# Patient Record
Sex: Female | Born: 1960 | Race: Black or African American | Hispanic: No | Marital: Married | Smoking: Never smoker
Health system: Southern US, Community
[De-identification: ages and names within clinical notes are randomized; demographics above are authoritative.]

## PROBLEM LIST (undated history)

## (undated) DIAGNOSIS — E669 Obesity, unspecified: Secondary | ICD-10-CM

## (undated) DIAGNOSIS — I10 Essential (primary) hypertension: Secondary | ICD-10-CM

## (undated) DIAGNOSIS — J309 Allergic rhinitis, unspecified: Secondary | ICD-10-CM

## (undated) HISTORY — DX: Essential (primary) hypertension: I10

## (undated) HISTORY — DX: Allergic rhinitis, unspecified: J30.9

## (undated) HISTORY — PX: OTHER SURGICAL HISTORY: SHX169

## (undated) HISTORY — DX: Obesity, unspecified: E66.9

---

## 1998-10-16 ENCOUNTER — Ambulatory Visit (HOSPITAL_COMMUNITY): Admission: RE | Admit: 1998-10-16 | Discharge: 1998-10-16 | Payer: Self-pay | Admitting: Vascular Surgery

## 1998-12-07 ENCOUNTER — Encounter: Payer: Self-pay | Admitting: *Deleted

## 1998-12-07 ENCOUNTER — Emergency Department (HOSPITAL_COMMUNITY): Admission: EM | Admit: 1998-12-07 | Discharge: 1998-12-07 | Payer: Self-pay | Admitting: *Deleted

## 1999-01-18 ENCOUNTER — Other Ambulatory Visit: Admission: RE | Admit: 1999-01-18 | Discharge: 1999-01-18 | Payer: Self-pay | Admitting: Family Medicine

## 2001-07-14 HISTORY — PX: ABDOMINOPLASTY: SUR9

## 2013-05-18 ENCOUNTER — Ambulatory Visit: Payer: Self-pay | Admitting: Emergency Medicine

## 2013-05-18 VITALS — BP 156/92 | HR 83 | Temp 98.2°F | Resp 17 | Ht 64.0 in | Wt 194.0 lb

## 2013-05-18 DIAGNOSIS — S838X9A Sprain of other specified parts of unspecified knee, initial encounter: Secondary | ICD-10-CM

## 2013-05-18 DIAGNOSIS — S86111A Strain of other muscle(s) and tendon(s) of posterior muscle group at lower leg level, right leg, initial encounter: Secondary | ICD-10-CM

## 2013-05-18 MED ORDER — NAPROXEN SODIUM 550 MG PO TABS: 550.0000 mg | ORAL_TABLET | Freq: Two times a day (BID) | ORAL | Status: DC

## 2013-05-18 MED ORDER — CYCLOBENZAPRINE HCL 10 MG PO TABS: 10.0000 mg | ORAL_TABLET | Freq: Three times a day (TID) | ORAL | Status: DC | PRN

## 2013-05-18 NOTE — Progress Notes (Signed)
[facility] [address] [place] [address] [id number] [phone]  Date:  05/18/2013   Name:  Priscilla Lane   DOB:  August 13, 1960   MRN:  [id number]  PCP:  No primary provider on file.    Chief Complaint: Knee Pain   History of Present Illness:  Priscilla Lane is a 52 y.o. very pleasant female patient who presents with the following:  No history of injury or overuse developed pain in her posterior right knee for months.  Last night thinks she felt something "pop" and she has experienced increased pain since.  Able to walk.  No swelling,ecchymosis, deformity or other complaints.  No improvement with over the counter medications or other home remedies. Denies other complaint or health concern today.   There are no active problems to display for this patient.   No past medical history on file.  No past surgical history on file.  History  Substance Use Topics  . Smoking status: Never Smoker   . Smokeless tobacco: Not on file  . Alcohol Use: Not on file    No family history on file.  No Known Allergies  Medication list has been reviewed and updated.  No current outpatient prescriptions on file prior to visit.   No current facility-administered medications on file prior to visit.    Review of Systems:  As per HPI, otherwise negative.    Physical Examination: Filed Vitals:   05/18/13 1819  BP: 156/92  Pulse: 83  Temp: 98.2 F (36.8 C)  Resp: 17   Filed Vitals:   05/18/13 1819  Height: 5\' 4"  (1.626 m)  Weight: 194 lb (87.998 kg)   Body mass index is 33.28 kg/(m^2). Ideal Body Weight: Weight in (lb) to have BMI = 25: 145.3   GEN: WDWN, NAD, Non-toxic, Alert & Oriented x 3 HEENT: Atraumatic, Normocephalic.  Ears and Nose: No external deformity. EXTR: No clubbing/cyanosis/edema NEURO: Normal gait.  PSYCH: Normally interactive. Conversant. Not depressed or anxious appearing.  Calm demeanor.  RIGHT knee:  No swelling, tenderness,  ecchymosis.  No deformity.  Full AROM.    Assessment and Plan: Gastrocnemius tear Anaprox Follow up with ortho    Signed,  Phillips Odor, MD

## 2013-11-27 ENCOUNTER — Ambulatory Visit (INDEPENDENT_AMBULATORY_CARE_PROVIDER_SITE_OTHER): Payer: BC Managed Care – PPO | Admitting: Family Medicine

## 2013-11-27 VITALS — BP 158/90 | HR 70 | Temp 97.8°F | Resp 16 | Ht 62.0 in | Wt 185.6 lb

## 2013-11-27 DIAGNOSIS — K0510 Chronic gingivitis, plaque induced: Secondary | ICD-10-CM

## 2013-11-27 MED ORDER — PENICILLIN V POTASSIUM 500 MG PO TABS: 500.0000 mg | ORAL_TABLET | Freq: Three times a day (TID) | ORAL | Status: DC

## 2013-11-27 NOTE — Progress Notes (Signed)
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 She comes in with a two-day history of illness in her mouth.  Objective: Patient has mild erythema around the gums, no adenopathy Skin:  Normal  Gingivitis - Plan: penicillin v potassium (VEETID) 500 MG tablet  Signed, Robyn Haber, MD

## 2013-11-27 NOTE — Patient Instructions (Signed)
Gingivitis Gingivitis is a form of gum (periodontal) disease that causes redness, soreness, and swelling (inflammation) of your gums. CAUSES The most common cause of gingivitis is poor oral hygiene. A sticky substance made of bacteria, mucus, and food particles (plaque), is deposited on the exposed part of teeth. As plaque builds up, it reacts with the saliva in your mouth to form something called  tartar. Tartar is a hard deposit that becomes trapped around the base of the tooth. Plaque and tartar irritate the gums, leading to the formation of gingivitis. Other factors that increase your risk for gingivitis include:   Tobacco use.  Diabetes.  Older age.  Certain medications.  Certain viral or fungal infections.  Dry mouth.  Hormonal changes such as during pregnancy.  Poor nutrition.  Substance abuse.  Poor fitting dental restorations or appliances. SYMPTOMS You may notice inflammation of the soft tissue (gingiva) around the teeth. When these tissues become inflamed, they bleed easily, especially during flossing or brushing. The gums may also be:   Tender to the touch.  Bright red, purple red, or have a shiny appearance.  Swollen.  Wearing away from the teeth (receding), which exposes more of the tooth. Bad breath is often present. Continued infection around teeth can eventually cause cavities and loosen teeth. This may lead to eventual tooth loss. DIAGNOSIS A medical and dental history will be taken. Your mouth, teeth, and gums will be examined. Your dentist will look for soft, swollen purple-red, irritated gums. There may be deposits of plaque and tartar at the base of the teeth. Your gums will be looked at for the degree of redness, puffiness, and bleeding tendencies. Your dentist will see if any of the teeth are loose. X-rays may be taken to see if the inflammation has spread to the supporting structures of the teeth. TREATMENT The goal is to reduce and reverse the  inflammation. Proper treatment can usually reverse the symptoms of gingivitis and prevent further progression of the disease. Have your teeth cleaned. During the cleaning, all plaque and tartar will be removed. Instruction for proper home care will be given. You will need regular professional cleanings and check-ups in the future. HOME CARE INSTRUCTIONS  Brush your teeth twice a day and floss at least once per day. When flossing, it is best to floss first then brush.  Limit sugar between meals and maintain a well-balanced diet.  Even the best dental hygiene will not prevent plaque from developing. It is necessary for you to see your dentist on a regular basis for cleaning and regular checkups.  Your dentist can recommend proper oral hygiene and mouth care and suggest special toothpastes or mouth rinses.  Stop smoking. SEEK DENTAL OR MEDICAL CARE IF:  You have painful, reddened tissue around your teeth, or you have puffy swollen gums.  You have difficulty chewing.  You notice any loose or infected teeth.  You have swollen glands.  Your gums bleed easily when you brush your teeth or are very tender to the touch. Document Released: 12/24/2000 Document Revised: 09/22/2011 Document Reviewed: 10/04/2010 [employer] Patient Information 2014 [employer].  

## 2014-01-12 ENCOUNTER — Ambulatory Visit (INDEPENDENT_AMBULATORY_CARE_PROVIDER_SITE_OTHER): Payer: BC Managed Care – PPO | Admitting: Family Medicine

## 2014-01-12 ENCOUNTER — Encounter: Payer: Self-pay | Admitting: Family Medicine

## 2014-01-12 VITALS — BP 160/94 | HR 85 | Temp 98.2°F | Ht 62.25 in | Wt 188.0 lb

## 2014-01-12 DIAGNOSIS — M79643 Pain in unspecified hand: Secondary | ICD-10-CM | POA: Insufficient documentation

## 2014-01-12 DIAGNOSIS — E6609 Other obesity due to excess calories: Secondary | ICD-10-CM | POA: Insufficient documentation

## 2014-01-12 DIAGNOSIS — Z6833 Body mass index (BMI) 33.0-33.9, adult: Secondary | ICD-10-CM

## 2014-01-12 DIAGNOSIS — E669 Obesity, unspecified: Secondary | ICD-10-CM

## 2014-01-12 DIAGNOSIS — M79609 Pain in unspecified limb: Secondary | ICD-10-CM

## 2014-01-12 DIAGNOSIS — I10 Essential (primary) hypertension: Secondary | ICD-10-CM | POA: Insufficient documentation

## 2014-01-12 NOTE — Assessment & Plan Note (Signed)
No pain today - advised return when having flare of pain for evaluation.

## 2014-01-12 NOTE — Progress Notes (Signed)
BP 160/94  Pulse 85  Temp(Src) 98.2 F (36.8 C) (Oral)  Ht 5' 2.25" (1.581 m)  Wt 188 lb (85.276 kg)  BMI 34.12 kg/m2  SpO2 98%   CC: new pt to establish  Subjective:    Patient ID: Priscilla Lane, female    DOB: [birth year], 53 y.o.   MRN: [id number]  HPI: Priscilla Lane is a 53 y.o. female presenting on 01/12/2014 for Establish Care   HTN - has had elevated readings in the past. fmhx (mother). No HA, vision changes, CP/tightness, SOB, leg swelling.  BP Readings from Last 3 Encounters:  01/12/14 160/94  11/27/13 158/90  05/18/13 156/92    Bilateral hand pain - ongoing for last year. No swelling, redness, warmth. 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 Denies current paresthesias.  Obesity - tries to walk some with husband. Wt Readings from Last 3 Encounters:  01/12/14 188 lb (85.276 kg)  11/27/13 185 lb 9.6 oz (84.188 kg)  05/18/13 194 lb (87.998 kg)   Body mass index is 34.12 kg/(m^2).  Preventative: No recent CPE Colon cancer screening -  Well woman - no recent pap LMP 2 yrs ago. 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  Allergies and medications reviewed and updated. No current outpatient prescriptions on file prior to visit.   No current facility-administered medications on file prior to visit.    Review of Systems Per HPI unless specifically indicated above    Objective:    BP 160/94  Pulse 85  Temp(Src) 98.2 F (36.8 C) (Oral)  Ht 5' 2.25" (1.581 m)  Wt 188 lb (85.276 kg)  BMI 34.12 kg/m2  SpO2 98%  Physical Exam  Nursing note and vitals reviewed. Constitutional: She appears well-developed and  well-nourished. No distress.  RH  HENT:  Head: Normocephalic and atraumatic.  Mouth/Throat: Oropharynx is clear and moist. No oropharyngeal exudate.  Eyes: Conjunctivae and EOM are normal. Pupils are equal, round, and reactive to light. No scleral icterus.  Neck: Normal range of motion. Neck supple. No thyromegaly present.  Cardiovascular: Normal rate, regular rhythm, normal heart sounds and intact distal pulses.   No murmur heard. Pulmonary/Chest: Effort normal and breath sounds normal. No respiratory distress. She has no wheezes. She has no rales.  Musculoskeletal: She exhibits no edema.  No pain at wrist, CMC, or Thumb joint. No pain at anatomical snuff box. L hand with nodule lateral ventral wrist, nontender to palpation  Skin: Skin is warm and dry. No rash noted.  Psychiatric: She has a normal mood and affect.   No results found for this or any previous visit.    Assessment & Plan:   Problem List Items Addressed This Visit   Obesity     Reviewed importance of regular exercise to affect sustained weight loss. Reviewed healthy diet changes.    Hypertension, essential - Primary     Elevated today. Discussed dietary changes and lifestyle changes to affect improved BP. Patient education handout provided as well as DASH diet handing. Will recheck next visit at CPE, if persistently elevated, will start bp med. Pt agrees with plan.    Hand pain     No pain today - advised return when  having flare of pain for evaluation.        Follow up plan: Return in about 3 months (around 04/14/2014), or as needed, for annual exam, prior fasting for blood work.

## 2014-01-12 NOTE — Assessment & Plan Note (Signed)
Elevated today. Discussed dietary changes and lifestyle changes to affect improved BP. Patient education handout provided as well as DASH diet handing. Will recheck next visit at CPE, if persistently elevated, will start bp med. Pt agrees with plan.

## 2014-01-12 NOTE — Assessment & Plan Note (Signed)
Reviewed importance of regular exercise to affect sustained weight loss. Reviewed healthy diet changes.

## 2014-01-12 NOTE — Patient Instructions (Signed)
Your goal blood pressure is <140/90. Look into bp cuff for home use , or may check bp at local pharmacy. Start monitoring bp at home or local pharmacy a few times a month and jot #s down.  Work on low salt/sodium diet - goal <1.5gm (1,500mg  sodium) per day. Eat a diet high in fruits/vegetables and whole grains.  Look into mediterranean and DASH diet. Goal activity is 189min/wk of moderate intensity exercise.  This can be split into 30 minute chunks.  If you are not at this level, you can start with smaller 10-15 min increments and slowly build up activity. Look at [place].org for more resources. We'll recheck your blood pressure at your physical.  DASH Eating Plan DASH stands for "Dietary Approaches to Stop Hypertension." The DASH eating plan is a healthy eating plan that has been shown to reduce high blood pressure (hypertension). Additional health benefits may include reducing the risk of type 2 diabetes mellitus, heart disease, and stroke. The DASH eating plan may also help with weight loss. WHAT DO I NEED TO KNOW ABOUT THE DASH EATING PLAN? For the DASH eating plan, you will follow these general guidelines:  Choose foods with a percent daily value for sodium of less than 5% (as listed on the food label).  Use salt-free seasonings or herbs instead of table salt or sea salt.  Check with your health care provider or pharmacist before using salt substitutes.  Eat lower-sodium products, often labeled as "lower sodium" or "no salt added."  Eat fresh foods.  Eat more vegetables, fruits, and low-fat dairy products.  Choose whole grains. Look for the word "whole" as the first word in the ingredient list.  Choose fish and skinless chicken or [place] more often than red meat. Limit fish, poultry, and meat to 6 oz (170 g) each day.  Limit sweets, desserts, sugars, and sugary drinks.  Choose heart-healthy fats.  Limit cheese to 1 oz (28 g) per day.  Eat more home-cooked food and less  restaurant, buffet, and fast food.  Limit fried foods.  Cook foods using methods other than frying.  Limit canned vegetables. If you do use them, rinse them well to decrease the sodium.  When eating at a restaurant, ask that your food be prepared with less salt, or no salt if possible. WHAT FOODS CAN I EAT? Seek help from a dietitian for individual calorie needs. Grains Whole grain or whole wheat bread. Brown rice. Whole grain or whole wheat pasta. Quinoa, bulgur, and whole grain cereals. Low-sodium cereals. Corn or whole wheat flour tortillas. Whole grain cornbread. Whole grain crackers. Low-sodium crackers. Vegetables Fresh or frozen vegetables (raw, steamed, roasted, or grilled). Low-sodium or reduced-sodium tomato and vegetable juices. Low-sodium or reduced-sodium tomato sauce and paste. Low-sodium or reduced-sodium canned vegetables.  Fruits All fresh, canned (in natural juice), or frozen fruits. Meat and Other Protein Products Ground beef (85% or leaner), grass-fed beef, or beef trimmed of fat. Skinless chicken or [place]. Ground chicken or [place]. Pork trimmed of fat. All fish and seafood. Eggs. Dried beans, peas, or lentils. Unsalted nuts and seeds. Unsalted canned beans. Dairy Low-fat dairy products, such as skim or 1% milk, 2% or reduced-fat cheeses, low-fat ricotta or cottage cheese, or plain low-fat yogurt. Low-sodium or reduced-sodium cheeses. Fats and Oils Tub margarines without trans fats. Light or reduced-fat mayonnaise and salad dressings (reduced sodium). Avocado. Safflower, olive, or canola oils. Natural peanut or almond butter. Other Unsalted popcorn and pretzels. The items listed above may not be  a complete list of recommended foods or beverages. Contact your dietitian for more options. WHAT FOODS ARE NOT RECOMMENDED? Grains White bread. White pasta. White rice. Refined cornbread. Bagels and croissants. Crackers that contain trans fat. Vegetables Creamed or fried  vegetables. Vegetables in a cheese sauce. Regular canned vegetables. Regular canned tomato sauce and paste. Regular tomato and vegetable juices. Fruits Dried fruits. Canned fruit in light or heavy syrup. Fruit juice. Meat and Other Protein Products Fatty cuts of meat. Ribs, chicken wings, bacon, sausage, bologna, salami, chitterlings, fatback, hot dogs, bratwurst, and packaged luncheon meats. Salted nuts and seeds. Canned beans with salt. Dairy Whole or 2% milk, cream, half-and-half, and cream cheese. Whole-fat or sweetened yogurt. Full-fat cheeses or blue cheese. Nondairy creamers and whipped toppings. Processed cheese, cheese spreads, or cheese curds. Condiments Onion and garlic salt, seasoned salt, table salt, and sea salt. Canned and packaged gravies. Worcestershire sauce. Tartar sauce. Barbecue sauce. Teriyaki sauce. Soy sauce, including reduced sodium. Steak sauce. Fish sauce. Oyster sauce. Cocktail sauce. Horseradish. Ketchup and mustard. Meat flavorings and tenderizers. Bouillon cubes. Hot sauce. Tabasco sauce. Marinades. Taco seasonings. Relishes. Fats and Oils Butter, stick margarine, lard, shortening, ghee, and bacon fat. Coconut, palm kernel, or palm oils. Regular salad dressings. Other Pickles and olives. Salted popcorn and pretzels. The items listed above may not be a complete list of foods and beverages to avoid. Contact your dietitian for more information. WHERE CAN I FIND MORE INFORMATION? National Heart, Lung, and Blood Institute: [employer]stabloid.com Document Released: 06/19/2011 Document Revised: 07/05/2013 Document Reviewed: 05/04/2013 [facility] Patient Information 2015 [place], [place]. This information is not intended to replace advice given to you by your health care provider. Make sure you discuss any questions you have with your health care provider.

## 2014-01-12 NOTE — Progress Notes (Signed)
Pre visit review using our clinic review tool, if applicable. No additional management support is needed unless otherwise documented below in the visit note. 

## 2014-04-08 ENCOUNTER — Other Ambulatory Visit: Payer: Self-pay | Admitting: Family Medicine

## 2014-04-08 DIAGNOSIS — I10 Essential (primary) hypertension: Secondary | ICD-10-CM

## 2014-04-08 DIAGNOSIS — E669 Obesity, unspecified: Secondary | ICD-10-CM

## 2014-04-10 ENCOUNTER — Other Ambulatory Visit (INDEPENDENT_AMBULATORY_CARE_PROVIDER_SITE_OTHER): Payer: BC Managed Care – PPO

## 2014-04-10 DIAGNOSIS — I10 Essential (primary) hypertension: Secondary | ICD-10-CM

## 2014-04-10 DIAGNOSIS — E669 Obesity, unspecified: Secondary | ICD-10-CM

## 2014-04-10 LAB — LIPID PANEL
Cholesterol: 198 mg/dL (ref 0–200)
HDL: 55.6 mg/dL (ref >39.00)
LDL Cholesterol: 128 mg/dL — ABNORMAL HIGH (ref 0–99)
NonHDL: 142.4
Total CHOL/HDL Ratio: 4
Triglycerides: 74 mg/dL (ref 0.0–149.0)
VLDL: 14.8 mg/dL (ref 0.0–40.0)

## 2014-04-10 LAB — COMPREHENSIVE METABOLIC PANEL
ALT: 25 U/L (ref 0–35)
AST: 30 U/L (ref 0–37)
Albumin: 4.3 g/dL (ref 3.5–5.2)
Alkaline Phosphatase: 37 U/L — ABNORMAL LOW (ref 39–117)
BUN: 14 mg/dL (ref 6–23)
CO2: 29 mEq/L (ref 19–32)
Calcium: 9.3 mg/dL (ref 8.4–10.5)
Chloride: 104 mEq/L (ref 96–112)
Creatinine, Ser: 0.7 mg/dL (ref 0.4–1.2)
GFR: 117.77 mL/min (ref >60.00)
Glucose, Bld: 92 mg/dL (ref 70–99)
Potassium: 3.9 mEq/L (ref 3.5–5.1)
Sodium: 138 mEq/L (ref 135–145)
Total Bilirubin: 0.6 mg/dL (ref 0.2–1.2)
Total Protein: 7.2 g/dL (ref 6.0–8.3)

## 2014-04-10 LAB — TSH: TSH: 3.5 u[IU]/mL (ref 0.35–4.50)

## 2014-04-14 ENCOUNTER — Ambulatory Visit (INDEPENDENT_AMBULATORY_CARE_PROVIDER_SITE_OTHER): Payer: BC Managed Care – PPO | Admitting: Family Medicine

## 2014-04-14 ENCOUNTER — Encounter: Payer: Self-pay | Admitting: Internal Medicine

## 2014-04-14 ENCOUNTER — Other Ambulatory Visit (HOSPITAL_COMMUNITY)
Admission: RE | Admit: 2014-04-14 | Discharge: 2014-04-14 | Disposition: A | Discharge status: Home / Self Care | Payer: BC Managed Care – PPO | Source: Ambulatory Visit | Attending: Family Medicine | Admitting: Family Medicine

## 2014-04-14 ENCOUNTER — Encounter: Payer: Self-pay | Admitting: Family Medicine

## 2014-04-14 VITALS — BP 136/78 | HR 91 | Temp 98.3°F | Ht 62.0 in | Wt 182.0 lb

## 2014-04-14 DIAGNOSIS — Z0000 Encounter for general adult medical examination without abnormal findings: Secondary | ICD-10-CM | POA: Insufficient documentation

## 2014-04-14 DIAGNOSIS — Z1151 Encounter for screening for human papillomavirus (HPV): Secondary | ICD-10-CM | POA: Insufficient documentation

## 2014-04-14 DIAGNOSIS — Z1211 Encounter for screening for malignant neoplasm of colon: Secondary | ICD-10-CM

## 2014-04-14 DIAGNOSIS — Z01411 Encounter for gynecological examination (general) (routine) with abnormal findings: Secondary | ICD-10-CM | POA: Insufficient documentation

## 2014-04-14 DIAGNOSIS — Z1239 Encounter for other screening for malignant neoplasm of breast: Secondary | ICD-10-CM

## 2014-04-14 DIAGNOSIS — N760 Acute vaginitis: Secondary | ICD-10-CM | POA: Diagnosis not present

## 2014-04-14 DIAGNOSIS — I10 Essential (primary) hypertension: Secondary | ICD-10-CM

## 2014-04-14 DIAGNOSIS — Z23 Encounter for immunization: Secondary | ICD-10-CM

## 2014-04-14 NOTE — Progress Notes (Signed)
BP 136/78  Pulse 91  Temp(Src) 98.3 F (36.8 C) (Oral)  Ht 5\' 2"  (1.575 m)  Wt 182 lb (82.555 kg)  BMI 33.28 kg/m2  SpO2 96%   CC: CPE  Subjective:    Patient ID: Priscilla Lane, female    DOB: 10/11/1960, 53 y.o.   MRN: [id number]  HPI: Priscilla Lane is a 53 y.o. female presenting on 04/14/2014 for Annual Exam   bp running better today. Has decreased salt in diet.  BP Readings from Last 3 Encounters:  04/14/14 136/78  01/12/14 160/94  11/27/13 158/90   Wt Readings from Last 3 Encounters:  04/14/14 182 lb (82.555 kg)  01/12/14 188 lb (85.276 kg)  11/27/13 185 lb 9.6 oz (84.188 kg)    Preventative:  Colon cancer screening - discussed, would like colonoscopy Well woman - no recent pap. LMP 2 yrs ago. Menopausal at age 5yo. Occasional hot flashes.  Mammogram - 2002. Due for this. Breast exam at home and today. 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  Allergies and medications reviewed and updated. No current outpatient prescriptions on file prior to visit.   No current facility-administered medications on file prior to visit.    Review of Systems  Constitutional: Negative for fever, chills, activity change, appetite change, fatigue and unexpected weight change.  HENT: Positive for sinus pressure (with weather change). Negative for hearing loss.   Eyes: Negative for visual disturbance.  Respiratory: Positive for cough (started today - allergy related?). Negative for chest tightness, shortness of breath and wheezing.   Cardiovascular: Negative for chest pain, palpitations and leg swelling.  Gastrointestinal: Negative for nausea,  vomiting, abdominal pain, diarrhea, constipation, blood in stool and abdominal distention.  Genitourinary: Negative for hematuria and difficulty urinating.  Musculoskeletal: Negative for arthralgias, myalgias and neck pain.  Skin: Negative for rash.  Neurological: Negative for dizziness, seizures, syncope and headaches.  Hematological: Negative for adenopathy. Does not bruise/bleed easily.  Psychiatric/Behavioral: Negative for dysphoric mood. The patient is not nervous/anxious.    Per HPI unless specifically indicated above    Objective:    BP 136/78  Pulse 91  Temp(Src) 98.3 F (36.8 C) (Oral)  Ht 5\' 2"  (1.575 m)  Wt 182 lb (82.555 kg)  BMI 33.28 kg/m2  SpO2 96%  Physical Exam  Nursing note and vitals reviewed. Constitutional: She is oriented to person, place, and time. She appears well-developed and well-nourished. No distress.  HENT:  Head: Normocephalic and atraumatic.  Right Ear: Hearing, tympanic membrane, external ear and ear canal normal.  Left Ear: Hearing, tympanic membrane, external ear and ear canal normal.  Nose: Nose normal.  Mouth/Throat: Uvula is midline, oropharynx is clear and moist and mucous membranes are normal. No oropharyngeal exudate, posterior oropharyngeal edema or posterior oropharyngeal erythema.  Eyes: Conjunctivae and EOM are normal. Pupils are equal, round, and reactive to light. No scleral icterus.  Neck: Normal range of motion. Neck supple. No thyromegaly present.  Cardiovascular: Normal rate, regular rhythm, normal heart sounds and intact distal pulses.   No murmur heard. Pulses:      Radial pulses are 2+ on the right side, and 2+ on the left side.  Pulmonary/Chest: Effort normal  and breath sounds normal. No respiratory distress. She has no wheezes. She has no rales. Right breast exhibits no inverted nipple, no mass, no nipple discharge, no skin change and no tenderness. Left breast exhibits no inverted nipple, no mass, no nipple discharge, no skin  change and no tenderness.  Abdominal: Soft. Bowel sounds are normal. She exhibits no distension and no mass. There is no tenderness. There is no rebound and no guarding.  Genitourinary: Uterus normal. Pelvic exam was performed with patient supine. There is no rash, tenderness, lesion or injury on the right labia. There is no rash, tenderness, lesion or injury on the left labia. Cervix exhibits no motion tenderness, no discharge and no friability. Right adnexum displays no mass, no tenderness and no fullness. Left adnexum displays no mass, no tenderness and no fullness. Vaginal discharge (mild thick white) found.  Pt denies any yeast vaginitis sxs  Musculoskeletal: Normal range of motion. She exhibits no edema.  Lymphadenopathy:       Head (right side): No submental, no submandibular, no tonsillar, no preauricular and no posterior auricular adenopathy present.       Head (left side): No submental, no submandibular, no tonsillar, no preauricular and no posterior auricular adenopathy present.    She has no cervical adenopathy.    She has no axillary adenopathy.       Right axillary: No lateral adenopathy present.       Left axillary: No lateral adenopathy present.      Right: No supraclavicular adenopathy present.       Left: No supraclavicular adenopathy present.  Neurological: She is alert and oriented to person, place, and time.  CN grossly intact, station and gait intact  Skin: Skin is warm and dry. No rash noted.  Psychiatric: She has a normal mood and affect. Her behavior is normal. Judgment and thought content normal.   Results for orders placed in visit on 04/10/14  LIPID PANEL      Result Value Ref Range   Cholesterol 198  0 - 200 mg/dL   Triglycerides 74.0  0.0 - 149.0 mg/dL   HDL 55.60  >39.00 mg/dL   VLDL 14.8  0.0 - 40.0 mg/dL   LDL Cholesterol 128 (*) 0 - 99 mg/dL   Total CHOL/HDL Ratio 4     NonHDL 142.40    COMPREHENSIVE METABOLIC PANEL      Result Value Ref Range   Sodium  138  135 - 145 mEq/L   Potassium 3.9  3.5 - 5.1 mEq/L   Chloride 104  96 - 112 mEq/L   CO2 29  19 - 32 mEq/L   Glucose, Bld 92  70 - 99 mg/dL   BUN 14  6 - 23 mg/dL   Creatinine, Ser 0.7  0.4 - 1.2 mg/dL   Total Bilirubin 0.6  0.2 - 1.2 mg/dL   Alkaline Phosphatase 37 (*) 39 - 117 U/L   AST 30  0 - 37 U/L   ALT 25  0 - 35 U/L   Total Protein 7.2  6.0 - 8.3 g/dL   Albumin 4.3  3.5 - 5.2 g/dL   Calcium 9.3  8.4 - 10.5 mg/dL   GFR 117.77  >60.00 mL/min  TSH      Result Value Ref Range   TSH 3.50  0.35 - 4.50 uIU/mL      Assessment & Plan:   Problem List Items Addressed This Visit   Hypertension, essential     Stable today off meds.  She has bought bp cuff so she can monitor bp at home. Will notify me if persistently elevated.    Health maintenance examination - Primary     Preventative protocols reviewed and updated unless pt declined. Discussed healthy diet and lifestyle. Breast exam, will schedule mammogram and colonoscopy per pt request. Encouraged start calcium /vit D (600/400) 1 tablet daily given calcium deficiency in diet. Tdap today.     Other Visit Diagnoses   Colon cancer screening        Relevant Orders       Ambulatory referral to Gastroenterology    Screening for breast cancer        Relevant Orders       MM DIGITAL SCREENING BILATERAL        Follow up plan: Return in about 1 year (around 04/15/2015), or as needed, for physical.

## 2014-04-14 NOTE — Assessment & Plan Note (Signed)
Preventative protocols reviewed and updated unless pt declined. Discussed healthy diet and lifestyle. Breast exam, will schedule mammogram and colonoscopy per pt request. Encouraged start calcium /vit D (600/400) 1 tablet daily given calcium deficiency in diet. Tdap today.

## 2014-04-14 NOTE — Patient Instructions (Signed)
Tdap today. Pass by Priscilla Lane's office for referral for mammogram and colonoscopy. Look into calcium/vit D supplement once daily (I'd recommend 600mg /400 unit dose). Good to see you today, call us with questions. Return as needed or in 1 year for next physical

## 2014-04-14 NOTE — Assessment & Plan Note (Signed)
Stable today off meds. She has bought bp cuff so she can monitor bp at home. Will notify me if persistently elevated.

## 2014-04-14 NOTE — Progress Notes (Signed)
Pre visit review using our clinic review tool, if applicable. No additional management support is needed unless otherwise documented below in the visit note. 

## 2014-04-17 ENCOUNTER — Telehealth: Payer: Self-pay | Admitting: Family Medicine

## 2014-04-17 LAB — CYTOLOGY - PAP

## 2014-04-17 NOTE — Telephone Encounter (Signed)
emmi mailed  °

## 2014-04-24 LAB — CERVICOVAGINAL ANCILLARY ONLY: CANDIDA VAGINITIS: NEGATIVE

## 2014-05-05 ENCOUNTER — Ambulatory Visit
Admission: RE | Admit: 2014-05-05 | Discharge: 2014-05-05 | Disposition: A | Discharge status: Home / Self Care | Payer: BC Managed Care – PPO | Source: Ambulatory Visit | Attending: Family Medicine | Admitting: Family Medicine

## 2014-05-05 DIAGNOSIS — Z1239 Encounter for other screening for malignant neoplasm of breast: Secondary | ICD-10-CM

## 2014-05-23 ENCOUNTER — Other Ambulatory Visit: Payer: Self-pay | Admitting: Family Medicine

## 2014-05-23 DIAGNOSIS — R928 Other abnormal and inconclusive findings on diagnostic imaging of breast: Secondary | ICD-10-CM

## 2014-06-05 ENCOUNTER — Ambulatory Visit
Admission: RE | Admit: 2014-06-05 | Discharge: 2014-06-05 | Disposition: A | Discharge status: Home / Self Care | Payer: BC Managed Care – PPO | Source: Ambulatory Visit | Attending: Family Medicine | Admitting: Family Medicine

## 2014-06-05 DIAGNOSIS — R928 Other abnormal and inconclusive findings on diagnostic imaging of breast: Secondary | ICD-10-CM

## 2014-06-05 LAB — HM MAMMOGRAPHY: HM Mammogram: NORMAL

## 2014-06-06 ENCOUNTER — Encounter: Payer: Self-pay | Admitting: *Deleted

## 2014-06-13 HISTORY — PX: COLONOSCOPY: SHX174

## 2014-06-19 ENCOUNTER — Ambulatory Visit (AMBULATORY_SURGERY_CENTER): Payer: Self-pay | Admitting: *Deleted

## 2014-06-19 VITALS — Ht 66.0 in | Wt 186.0 lb

## 2014-06-19 DIAGNOSIS — Z1211 Encounter for screening for malignant neoplasm of colon: Secondary | ICD-10-CM

## 2014-06-19 MED ORDER — MOVIPREP 100 G PO SOLR: 1.0000 | Freq: Once | ORAL | Status: DC

## 2014-06-19 NOTE — Progress Notes (Signed)
No egg or soy allergy. ewm no blood thinners, no diet pills. ewm No problems with past sedation. ewm No home 02 use. ewm Pt declined emmi. ewm

## 2014-07-03 ENCOUNTER — Encounter: Payer: Self-pay | Admitting: Internal Medicine

## 2014-07-03 ENCOUNTER — Ambulatory Visit (AMBULATORY_SURGERY_CENTER): Payer: BC Managed Care – PPO | Admitting: Internal Medicine

## 2014-07-03 VITALS — BP 157/99 | HR 66 | Resp 21

## 2014-07-03 DIAGNOSIS — K635 Polyp of colon: Secondary | ICD-10-CM

## 2014-07-03 DIAGNOSIS — D120 Benign neoplasm of cecum: Secondary | ICD-10-CM

## 2014-07-03 DIAGNOSIS — Z1211 Encounter for screening for malignant neoplasm of colon: Secondary | ICD-10-CM

## 2014-07-03 MED ORDER — SODIUM CHLORIDE 0.9 % IV SOLN: 500.0000 mL | INTRAVENOUS | Status: DC

## 2014-07-03 NOTE — Progress Notes (Signed)
Report to PACU, RN, vss, BBS= Clear.  

## 2014-07-03 NOTE — Progress Notes (Signed)
Called to room to assist during endoscopic procedure.  Patient ID and intended procedure confirmed with present staff. Received instructions for my participation in the procedure from the performing physician.  

## 2014-07-03 NOTE — Patient Instructions (Signed)
Discharge instructions given. Handouts on polyps and diverticulosis. Resume previous medications. YOU HAD AN ENDOSCOPIC PROCEDURE TODAY AT THE [facility]: Refer to the procedure report that was given to you for any specific questions about what was found during the examination.  If the procedure report does not answer your questions, please call your gastroenterologist to clarify.  If you requested that your care partner not be given the details of your procedure findings, then the procedure report has been included in a sealed envelope for you to review at your convenience later.  YOU SHOULD EXPECT: Some feelings of bloating in the abdomen. Passage of more gas than usual.  Walking can help get rid of the air that was put into your GI tract during the procedure and reduce the bloating. If you had a lower endoscopy (such as a colonoscopy or flexible sigmoidoscopy) you may notice spotting of blood in your stool or on the toilet paper. If you underwent a bowel prep for your procedure, then you may not have a normal bowel movement for a few days.  DIET: Your first meal following the procedure should be a light meal and then it is ok to progress to your normal diet.  A half-sandwich or bowl of soup is an example of a good first meal.  Heavy or fried foods are harder to digest and may make you feel nauseous or bloated.  Likewise meals heavy in dairy and vegetables can cause extra gas to form and this can also increase the bloating.  Drink plenty of fluids but you should avoid alcoholic beverages for 24 hours.  ACTIVITY: Your care partner should take you home directly after the procedure.  You should plan to take it easy, moving slowly for the rest of the day.  You can resume normal activity the day after the procedure however you should NOT DRIVE or use heavy machinery for 24 hours (because of the sedation medicines used during the test).    SYMPTOMS TO REPORT IMMEDIATELY: A gastroenterologist  can be reached at any hour.  During normal business hours, 8:30 AM to 5:00 PM Monday through Friday, call [phone].  After hours and on weekends, please call the GI answering service at [phone] who will take a message and have the physician on call contact you.   Following lower endoscopy (colonoscopy or flexible sigmoidoscopy):  Excessive amounts of blood in the stool  Significant tenderness or worsening of abdominal pains  Swelling of the abdomen that is new, acute  Fever of 100F or higher  FOLLOW UP: If any biopsies were taken you will be contacted by phone or by letter within the next 1-3 weeks.  Call your gastroenterologist if you have not heard about the biopsies in 3 weeks.  Our staff will call the home number listed on your records the next business day following your procedure to check on you and address any questions or concerns that you may have at that time regarding the information given to you following your procedure. This is a courtesy call and so if there is no answer at the home number and we have not heard from you through the emergency physician on call, we will assume that you have returned to your regular daily activities without incident.  SIGNATURES/CONFIDENTIALITY: You and/or your care partner have signed paperwork which will be entered into your electronic medical record.  These signatures attest to the fact that that the information above on your After Visit Summary has been reviewed   and is understood.  Full responsibility of the confidentiality of this discharge information lies with you and/or your care-partner. 

## 2014-07-03 NOTE — Op Note (Signed)
[place]  [address] [place], [id number]   COLONOSCOPY PROCEDURE REPORT  PATIENT: Priscilla Lane, Priscilla Lane  MR#: [id number] BIRTHDATE: March 21, 1961 , 59  yrs. old GENDER: female ENDOSCOPIST: Eustace Quail, MD REFERRED TD:HRCBUL Gutierrez, M.D. PROCEDURE DATE:  07/03/2014 PROCEDURE:   Colonoscopy with snare polypectomy x 1 First Screening Colonoscopy - Avg.  risk and is 50 yrs.  old or older Yes.  Prior Negative Screening - Now for repeat screening. N/A  History of Adenoma - Now for follow-up colonoscopy & has been > or = to 3 yrs.  N/A  Polyps Removed Today? Yes. ASA CLASS:   Class II INDICATIONS:average risk for colorectal cancer. MEDICATIONS: Monitored anesthesia care and Propofol 200 mg IV  DESCRIPTION OF PROCEDURE:   After the risks benefits and alternatives of the procedure were thoroughly explained, informed consent was obtained.  The digital rectal exam revealed no abnormalities of the rectum.   The LB [id number]  endoscope was introduced through the anus and advanced to the cecum, which was identified by both the appendix and ileocecal valve. No adverse events experienced.   The quality of the prep was excellent, using MoviPrep  The instrument was then slowly withdrawn as the colon was fully examined.    COLON FINDINGS: A single polyp measuring 2 mm in size was found at the cecum.  A polypectomy was performed with a cold snare.  The resection was complete, the polyp tissue was completely retrieved and sent to histology.   There was moderate diverticulosis noted throughout the entire examined colon.   The examination was otherwise normal.  Retroflexed views revealed no abnormalities. The time to cecum=2 minutes 33 seconds.  Withdrawal time=8 minutes 59 seconds.  The scope was withdrawn and the procedure completed. COMPLICATIONS: There were no immediate complications.  ENDOSCOPIC IMPRESSION: 1.   Single polyp measuring 2 mm in size was found at the  cecum; polypectomy was performed with a cold snare 2.   Moderate diverticulosis was noted throughout the entire examined colon 3.   The examination was otherwise normal  RECOMMENDATIONS: 1. Repeat colonoscopy in 5 years if polyp adenomatous; otherwise 10 years  eSigned:  Eustace Quail, MD 07/03/2014 9:50 AM   cc: Ria Bush MD and The Patient

## 2014-07-04 ENCOUNTER — Telehealth: Payer: Self-pay

## 2014-07-04 NOTE — Telephone Encounter (Signed)
  Follow up Call-  Call back number 07/03/2014  Post procedure Call Back phone  # [phone]  Permission to leave phone message Yes     Patient questions:  Do you have a fever, pain , or abdominal swelling? No. Pain Score  0 *  Have you tolerated food without any problems? Yes.    Have you been able to return to your normal activities? Yes.    Do you have any questions about your discharge instructions: Diet   No. Medications  No. Follow up visit  No.  Do you have questions or concerns about your Care? No.  Actions: * If pain score is 4 or above: No action needed, pain <4.

## 2014-07-06 ENCOUNTER — Encounter: Payer: Self-pay | Admitting: Internal Medicine

## 2014-07-12 ENCOUNTER — Encounter: Payer: Self-pay | Admitting: Family Medicine

## 2014-07-28 ENCOUNTER — Telehealth: Payer: Self-pay

## 2014-07-28 NOTE — Telephone Encounter (Signed)
[facility] on [address]   Requesting  Refill on mouth wash    [phone]

## 2014-07-28 NOTE — Telephone Encounter (Signed)
Advised to RTC. Says we gave her something for her gingivitis back in 11/2013 (although I didn't see anything other than PCN) She will come in

## 2014-07-29 ENCOUNTER — Ambulatory Visit (INDEPENDENT_AMBULATORY_CARE_PROVIDER_SITE_OTHER): Payer: BLUE CROSS/BLUE SHIELD | Admitting: Family Medicine

## 2014-07-29 VITALS — BP 158/86 | HR 81 | Temp 98.5°F | Resp 16 | Ht 62.25 in | Wt 184.2 lb

## 2014-07-29 DIAGNOSIS — J029 Acute pharyngitis, unspecified: Secondary | ICD-10-CM

## 2014-07-29 MED ORDER — MAGIC MOUTHWASH: 5.0000 mL | Freq: Four times a day (QID) | ORAL | Status: DC | PRN

## 2014-07-29 MED ORDER — PENICILLIN V POTASSIUM 500 MG PO TABS: 500.0000 mg | ORAL_TABLET | Freq: Three times a day (TID) | ORAL | Status: DC

## 2014-07-29 NOTE — Progress Notes (Signed)
° °  Subjective:    Patient ID: Priscilla Lane, female    DOB: [birth year], 54 y.o.   MRN: [id number] This chart was scribed for Priscilla Haber, MD by Marti Sleigh, Medical Scribe. This patient was seen in Room 11 and the patient's care was started a 12:50 PM.  Chief Complaint  Patient presents with   Mouth Irritation    HPI HPI Comments: [name] is a 54 y.o. female who presents to [facility] complaining of mouth irritation that started on Friday. Pt states she has had this happen before. Pt states in the past she has been prescribed a mouth wash which relieved her sx.    Review of Systems  Constitutional: Negative for fever and chills.  HENT: Positive for mouth sores.        Objective:   Physical Exam  Constitutional: She is oriented to person, place, and time. She appears well-developed and well-nourished.  HENT:  Head: Normocephalic and atraumatic.  Eyes: Pupils are equal, round, and reactive to light.  Neck: Neck supple.  Cardiovascular: Normal rate and regular rhythm.   Pulmonary/Chest: Effort normal and breath sounds normal. No respiratory distress.  Neurological: She is alert and oriented to person, place, and time.  Skin: Skin is warm and dry.  Psychiatric: She has a normal mood and affect. Her behavior is normal.  Nursing note and vitals reviewed.  Buccal mucosa reddened without exudates    Assessment & Plan:   This chart was scribed in my presence and reviewed by me personally.    ICD-9-CM ICD-10-CM   1. Acute pharyngitis, unspecified pharyngitis type 462 J02.9 Alum & Mag Hydroxide-Simeth (MAGIC MOUTHWASH) SOLN     penicillin v potassium (VEETID) 500 MG tablet     Signed, Priscilla Haber, MD

## 2014-09-22 ENCOUNTER — Ambulatory Visit (INDEPENDENT_AMBULATORY_CARE_PROVIDER_SITE_OTHER): Payer: BLUE CROSS/BLUE SHIELD | Admitting: Physician Assistant

## 2014-09-22 VITALS — BP 150/92 | HR 82 | Temp 97.5°F | Ht 63.0 in | Wt 188.8 lb

## 2014-09-22 DIAGNOSIS — M6248 Contracture of muscle, other site: Secondary | ICD-10-CM

## 2014-09-22 DIAGNOSIS — R030 Elevated blood-pressure reading, without diagnosis of hypertension: Secondary | ICD-10-CM

## 2014-09-22 DIAGNOSIS — M542 Cervicalgia: Secondary | ICD-10-CM | POA: Diagnosis not present

## 2014-09-22 DIAGNOSIS — M62838 Other muscle spasm: Secondary | ICD-10-CM

## 2014-09-22 DIAGNOSIS — IMO0001 Reserved for inherently not codable concepts without codable children: Secondary | ICD-10-CM

## 2014-09-22 MED ORDER — NAPROXEN 500 MG PO TABS: 500.0000 mg | ORAL_TABLET | Freq: Two times a day (BID) | ORAL | Status: DC

## 2014-09-22 MED ORDER — CYCLOBENZAPRINE HCL 10 MG PO TABS: 5.0000 mg | ORAL_TABLET | Freq: Three times a day (TID) | ORAL | Status: DC | PRN

## 2014-09-22 NOTE — Progress Notes (Signed)
09/22/2014 at 10:49 AM  Priscilla Lane / DOB: [birth year] / MRN: [id number]  The patient has Hypertension, essential; Obesity; Hand pain; and Health maintenance examination on her problem list.  SUBJECTIVE  Chief compalaint: Neck Pain   History of present illness: Priscilla Lane is 54 y.o. well appearing female presenting for left sided neck and upper back pain that started Monday and has gradually worsened.    She has tried Aleve and Advil with modest relief of her pain. She feels that it started with "sleeping in a funny position." She denies upper extremity weakness, paresthesia and a change in dexterity. She denies HA, and changes in vision.     She was unaware of her previous diagnosis of hypertension, and says she just saw her PCP in January.    She  has a past medical history of Allergic rhinitis; Hypertension, essential; and Obesity.    She currently has no medications in their medication list.  Priscilla Lane has No Known Allergies. She  reports that she has never smoked. She has never used smokeless tobacco. She reports that she does not drink alcohol or use illicit drugs. She  has no sexual activity history on file. The patient  has past surgical history that includes Abdominoplasty (2003) and Colonoscopy (06/2014).  Her family history includes CAD (age of onset: 76) in her mother; Cancer in her maternal grandfather; Cancer (age of onset: 34) in her maternal grandmother; Colon polyps in her father; Diabetes in her mother; Hypertension in her mother; Kidney disease in her mother; Ovarian cancer in her maternal grandmother. There is no history of Colon cancer, Rectal cancer, or Stomach cancer.  ROS  Per HPI  OBJECTIVE  Her  height is 5\' 3"  (1.6 m) and weight is 188 lb 12.8 oz (85.639 kg). Her oral temperature is 97.5 F (36.4 C). Her blood pressure is 150/92 and her pulse is 82. Her oxygen saturation is 98%.  The patient's body mass index is 33.45 kg/(m^2).  Physical Exam    Constitutional: She is oriented to person, place, and time.  Musculoskeletal:       Cervical back: She exhibits decreased range of motion (2/2 musclular tightness. ), tenderness, pain and spasm. She exhibits no bony tenderness, no swelling, no edema, no deformity and no laceration.       Back:  Neurological: She is alert and oriented to person, place, and time. She displays no atrophy and no tremor. No sensory deficit. She exhibits normal muscle tone. She displays no seizure activity. Coordination and gait normal.  Reflex Scores:      Tricep reflexes are 2+ on the right side and 2+ on the left side.      Bicep reflexes are 2+ on the right side and 2+ on the left side.      Brachioradialis reflexes are 2+ on the right side and 2+ on the left side.   No results found for this or any previous visit (from the past 24 hour(s)).  ASSESSMENT & PLAN  Priscilla Lane was seen today for neck pain.  Diagnoses and all orders for this visit:  Neck pain, musculoskeletal Orders: -     naproxen (NAPROSYN) 500 MG tablet; Take 1 tablet (500 mg total) by mouth 2 (two) times daily with a meal.  Muscle spasms of neck Orders: -     cyclobenzaprine (FLEXERIL) 10 MG tablet; Take 0.5-1 tablets (5-10 mg total) by mouth 3 (three) times daily as needed for muscle spasms.  Elevated blood pressure: She has  a blood pressure cuff. Patient advised to check her BP daily and if consistently greater than 140/90 advised that she call her PCP for treatment options.      The patient was advised to call or come back to clinic if she does not see an improvement in symptoms, or worsens with the above plan.   Philis Fendt, MHS, PA-C [facility] 09/22/2014 10:49 AM

## 2014-09-22 NOTE — Patient Instructions (Addendum)
Check your BP daily. If your blood pressure is consistently reading more than 140 over 90 then you should call your primary care doctor to discuss options for management.    Hypertension Hypertension, commonly called high blood pressure, is when the force of blood pumping through your arteries is too strong. Your arteries are the blood vessels that carry blood from your heart throughout your body. A blood pressure reading consists of a higher number over a lower number, such as 110/72. The higher number (systolic) is the pressure inside your arteries when your heart pumps. The lower number (diastolic) is the pressure inside your arteries when your heart relaxes. Ideally you want your blood pressure below 120/80. Hypertension forces your heart to work harder to pump blood. Your arteries may become narrow or stiff. Having hypertension puts you at risk for heart disease, stroke, and other problems.  RISK FACTORS Some risk factors for high blood pressure are controllable. Others are not.  Risk factors you cannot control include:   Race. You may be at higher risk if you are African American.  Age. Risk increases with age.  Gender. Men are at higher risk than women before age 26 years. After age 38, women are at higher risk than men. Risk factors you can control include:  Not getting enough exercise or physical activity.  Being overweight.  Getting too much fat, sugar, calories, or salt in your diet.  Drinking too much alcohol. SIGNS AND SYMPTOMS Hypertension does not usually cause signs or symptoms. Extremely high blood pressure (hypertensive crisis) may cause headache, anxiety, shortness of breath, and nosebleed. DIAGNOSIS  To check if you have hypertension, your health care provider will measure your blood pressure while you are seated, with your arm held at the level of your heart. It should be measured at least twice using the same arm. Certain conditions can cause a difference in blood  pressure between your right and left arms. A blood pressure reading that is higher than normal on one occasion does not mean that you need treatment. If one blood pressure reading is high, ask your health care provider about having it checked again. TREATMENT  Treating high blood pressure includes making lifestyle changes and possibly taking medicine. Living a healthy lifestyle can help lower high blood pressure. You may need to change some of your habits. Lifestyle changes may include:  Following the DASH diet. This diet is high in fruits, vegetables, and whole grains. It is low in salt, red meat, and added sugars.  Getting at least 2 hours of brisk physical activity every week.  Losing weight if necessary.  Not smoking.  Limiting alcoholic beverages.  Learning ways to reduce stress. If lifestyle changes are not enough to get your blood pressure under control, your health care provider may prescribe medicine. You may need to take more than one. Work closely with your health care provider to understand the risks and benefits. HOME CARE INSTRUCTIONS  Have your blood pressure rechecked as directed by your health care provider.   Take medicines only as directed by your health care provider. Follow the directions carefully. Blood pressure medicines must be taken as prescribed. The medicine does not work as well when you skip doses. Skipping doses also puts you at risk for problems.   Do not smoke.   Monitor your blood pressure at home as directed by your health care provider. SEEK MEDICAL CARE IF:   You think you are having a reaction to medicines taken.  You have  recurrent headaches or feel dizzy.  You have swelling in your ankles.  You have trouble with your vision. SEEK IMMEDIATE MEDICAL CARE IF:  You develop a severe headache or confusion.  You have unusual weakness, numbness, or feel faint.  You have severe chest or abdominal pain.  You vomit repeatedly.  You have  trouble breathing. MAKE SURE YOU:   Understand these instructions.  Will watch your condition.  Will get help right away if you are not doing well or get worse. Document Released: 06/30/2005 Document Revised: 11/14/2013 Document Reviewed: 04/22/2013 [facility] Patient Information 2015 [place], [place]. This information is not intended to replace advice given to you by your health care provider. Make sure you discuss any questions you have with your health care provider.

## 2014-10-30 ENCOUNTER — Telehealth: Payer: Self-pay | Admitting: Family Medicine

## 2014-10-30 NOTE — Telephone Encounter (Signed)
PLEASE NOTE: All timestamps contained within this report are represented as [place] Standard Time. CONFIDENTIALTY NOTICE: This fax transmission is intended only for the addressee. It contains information that is legally privileged, confidential or otherwise protected from use or disclosure. If you are not the intended recipient, you are strictly prohibited from reviewing, disclosing, copying using or disseminating any of this information or taking any action in reliance on or regarding this information. If you have received this fax in error, please notify us immediately by telephone so that we can arrange for its return to [place]. Phone: [phone], Toll-Free: [phone], Fax: [phone] Page: 1 of 1 Call Id: [id number] [place] Patient Name: Priscilla Lane DOB: 1961-04-18 Initial Comment Caller States she has not had a period in 3 years and just had one. is curious if she should be worried since she thinks she is going through menopause. Nurse Assessment Nurse: Mechele Dawley, RN, Amy Date/Time Eilene Ghazi Time): 10/30/2014 10:10:01 AM Confirm and document reason for call. If symptomatic, describe symptoms. ---Caller states that she went to md about last year and she is going through the change and was told she probably would not get a period. She states that she is having bright red and dark at times. No extreme abd pain. She does see the pcp for all of her issues. She states that this is just like a regular period and she did not know if this was normal or not. She is not on any hormone replacement at this time. Bleeding like a normal period. Has the patient traveled out of the country within the last 30 days? ---Not Applicable Does the patient require triage? ---Yes Related visit to physician within the last 2 weeks? ---No Does the PT have any chronic conditions? (i.e. diabetes, asthma,  etc.) ---No Did the patient indicate they were pregnant? ---No Guidelines Guideline Title Affirmed Question Affirmed Notes Vaginal Discharge Normal vaginal discharge (all triage questions negative) Final Disposition User [place], [place], [place]

## 2014-10-30 NOTE — Telephone Encounter (Signed)
Let's schedule office visit to review - as a period after menopause is abnormal.

## 2014-10-31 NOTE — Telephone Encounter (Signed)
Patient notified and appt scheduled.

## 2014-11-02 ENCOUNTER — Encounter: Payer: Self-pay | Admitting: Family Medicine

## 2014-11-02 ENCOUNTER — Ambulatory Visit (INDEPENDENT_AMBULATORY_CARE_PROVIDER_SITE_OTHER): Payer: BLUE CROSS/BLUE SHIELD | Admitting: Family Medicine

## 2014-11-02 VITALS — BP 152/84 | HR 89 | Temp 98.5°F | Wt 190.2 lb

## 2014-11-02 DIAGNOSIS — N950 Postmenopausal bleeding: Secondary | ICD-10-CM | POA: Insufficient documentation

## 2014-11-02 DIAGNOSIS — N898 Other specified noninflammatory disorders of vagina: Secondary | ICD-10-CM | POA: Diagnosis not present

## 2014-11-02 DIAGNOSIS — I10 Essential (primary) hypertension: Secondary | ICD-10-CM | POA: Diagnosis not present

## 2014-11-02 MED ORDER — AMLODIPINE BESYLATE 5 MG PO TABS: 5.0000 mg | ORAL_TABLET | Freq: Every day | ORAL | Status: DC

## 2014-11-02 NOTE — Progress Notes (Signed)
Pre visit review using our clinic review tool, if applicable. No additional management support is needed unless otherwise documented below in the visit note. 

## 2014-11-02 NOTE — Assessment & Plan Note (Signed)
Abnormal bleeding after menopause. Abnormal cervix on exam. Molecular wet prep today. Ordered pelvic ultrasound and will refer to GYN for further evaluation of abnormal bleeding. Pt agrees with plan.

## 2014-11-02 NOTE — Patient Instructions (Addendum)
Start vitamin D 1000 units daily. For blood pressure - staying elevated. Start amlodipine 5mg  daily sent to pharmacy. Wet prep today. We will also schedule you for a pelvic ultrasound to further evaluate the bleeding and refer you to OBGYN to discuss possible biopsy. We will call you to set this up.  Good to see you today, call us with questions.

## 2014-11-02 NOTE — Progress Notes (Signed)
BP 152/84 mmHg  Pulse 89  Temp(Src) 98.5 F (36.9 C) (Oral)  Wt 190 lb 4 oz (86.297 kg)  SpO2 96%   CC: vaginal bleeding  Subjective:    Patient ID: Priscilla Lane, female    DOB: 1961-05-16, 54 y.o.   MRN: [id number]  HPI: Priscilla Lane is a 54 y.o. female presenting on 11/02/2014 for Menorrhagia   Sunday noticed period-like bleed that lasted 3 days. Then yesterday again noticed bleeding. Prior to this, amenorrhea for 3 yrs.   Lives with husband. Monogamous relationship. No new lotions other than [place] lube for dryness.  Denies douching or other foreign objects.  No fevers/chills, abd pain, nausea, pelvic pain, vag discharge, denies vulvar skin changes.   Fmhx GYN cancer unsure type (grandmother age 44yo).   Last pap smear normal 04/2014 - with actinomyces appearing organisms on pap and candidal species on wet prep but as pt without h/o IUD, denied sxs, was not treated.  HTN - bp elevated at home as well. No HA, vision changes, CP/tightness, SOB, leg swelling.  BP Readings from Last 3 Encounters:  11/02/14 152/84  09/22/14 150/92  07/29/14 158/86   Relevant past medical, surgical, family and social history reviewed and updated as indicated. Interim medical history since our last visit reviewed. Allergies and medications reviewed and updated. No current outpatient prescriptions on file prior to visit.   No current facility-administered medications on file prior to visit.    Review of Systems Per HPI unless specifically indicated above     Objective:    BP 152/84 mmHg  Pulse 89  Temp(Src) 98.5 F (36.9 C) (Oral)  Wt 190 lb 4 oz (86.297 kg)  SpO2 96%  Wt Readings from Last 3 Encounters:  11/02/14 190 lb 4 oz (86.297 kg)  09/22/14 188 lb 12.8 oz (85.639 kg)  07/29/14 184 lb 4 oz (83.575 kg)    Physical Exam  Constitutional: She appears well-developed and well-nourished. No distress.  Genitourinary: Pelvic exam was performed with patient supine. There is no  rash, tenderness, lesion or injury on the right labia. There is no rash, tenderness, lesion or injury on the left labia. Cervix exhibits discharge and friability. There is bleeding in the vagina. No erythema or tenderness in the vagina. No foreign body around the vagina. No signs of injury around the vagina. No vaginal discharge found.  Bleeding in vagina without injury found. Cervix not erythematous but friable with thin film at areas.  Nursing note and vitals reviewed.     Assessment & Plan:   Problem List Items Addressed This Visit    Post-menopausal bleeding - Primary    Abnormal bleeding after menopause. Abnormal cervix on exam. Molecular wet prep today. Ordered pelvic ultrasound and will refer to GYN for further evaluation of abnormal bleeding. Pt agrees with plan.      Relevant Orders   US Pelvis Complete   US Transvaginal Non-OB   Ambulatory referral to Gynecology   Hypertension, essential    Discussed persistently elevated blood pressures - reviewed healthy diet and encouraged increased regular aerobic exercise. Anticipate hereditary component to hypertension. Start amlodipine 5mg  daily. Discussed monitoring for pedal edema.      Relevant Medications   amLODipine (NORVASC) 5 MG tablet    Other Visit Diagnoses    Vaginal discharge        Relevant Orders    WET PREP BY MOLECULAR PROBE        Follow up plan: No Follow-up on file.

## 2014-11-02 NOTE — Assessment & Plan Note (Signed)
Discussed persistently elevated blood pressures - reviewed healthy diet and encouraged increased regular aerobic exercise. Anticipate hereditary component to hypertension. Start amlodipine 5mg  daily. Discussed monitoring for pedal edema.

## 2014-11-03 ENCOUNTER — Other Ambulatory Visit: Payer: Self-pay | Admitting: Family Medicine

## 2014-11-03 LAB — WET PREP BY MOLECULAR PROBE
Candida species: NEGATIVE
Gardnerella vaginalis: POSITIVE — AB
Trichomonas vaginosis: NEGATIVE

## 2014-11-03 MED ORDER — METRONIDAZOLE 500 MG PO TABS: 500.0000 mg | ORAL_TABLET | Freq: Two times a day (BID) | ORAL | Status: DC

## 2014-11-10 ENCOUNTER — Ambulatory Visit (HOSPITAL_COMMUNITY)
Admission: RE | Admit: 2014-11-10 | Discharge: 2014-11-10 | Disposition: A | Discharge status: Home / Self Care | Payer: BLUE CROSS/BLUE SHIELD | Source: Ambulatory Visit | Attending: Family Medicine | Admitting: Family Medicine

## 2014-11-10 DIAGNOSIS — N8320 Unspecified ovarian cysts: Secondary | ICD-10-CM | POA: Diagnosis not present

## 2014-11-10 DIAGNOSIS — N950 Postmenopausal bleeding: Secondary | ICD-10-CM | POA: Insufficient documentation

## 2014-11-10 DIAGNOSIS — D259 Leiomyoma of uterus, unspecified: Secondary | ICD-10-CM | POA: Diagnosis not present

## 2014-11-27 ENCOUNTER — Other Ambulatory Visit: Payer: Self-pay | Admitting: Obstetrics and Gynecology

## 2014-11-27 ENCOUNTER — Encounter: Payer: Self-pay | Admitting: Family Medicine

## 2015-03-06 ENCOUNTER — Ambulatory Visit (INDEPENDENT_AMBULATORY_CARE_PROVIDER_SITE_OTHER): Payer: Self-pay | Admitting: Internal Medicine

## 2015-03-06 VITALS — BP 170/102 | HR 72 | Temp 98.0°F | Resp 16 | Ht 63.0 in | Wt 187.0 lb

## 2015-03-06 DIAGNOSIS — R21 Rash and other nonspecific skin eruption: Secondary | ICD-10-CM

## 2015-03-06 DIAGNOSIS — Z833 Family history of diabetes mellitus: Secondary | ICD-10-CM

## 2015-03-06 DIAGNOSIS — B372 Candidiasis of skin and nail: Secondary | ICD-10-CM

## 2015-03-06 LAB — POCT GLYCOSYLATED HEMOGLOBIN (HGB A1C): Hemoglobin A1C: 5.6

## 2015-03-06 LAB — GLUCOSE, POCT (MANUAL RESULT ENTRY): POC Glucose: 103 mg/dl — AB (ref 70–99)

## 2015-03-06 MED ORDER — FLUCONAZOLE 150 MG PO TABS: 150.0000 mg | ORAL_TABLET | Freq: Once | ORAL | Status: DC

## 2015-03-06 MED ORDER — MICONAZOLE NITRATE 2 % EX CREA: 1.0000 "application " | TOPICAL_CREAM | Freq: Two times a day (BID) | CUTANEOUS | Status: DC

## 2015-03-06 NOTE — Patient Instructions (Signed)

## 2015-03-06 NOTE — Progress Notes (Signed)
The rash is red Patient ID: Priscilla Lane, female   DOB: Jan 27, 1961, 54 y.o.   MRN: [id number]   03/06/2015 at 11:30 AM  Priscilla Lane / DOB: 11-12-1960 / MRN: [id number]  Problem list reviewed and updated by me where necessary.   SUBJECTIVE  Priscilla Lane is a 54 y.o. well appearing female presenting for the chief complaint of rash under breast, itchy and started after going to the beach. She has a family hx of diabetes.. The rash is red mostly macular with raised borders, and itchy. It is spreading locally.    She  has a past medical history of Allergic rhinitis; Hypertension, essential; and Obesity.    Medications reviewed and updated by myself where necessary, and exist elsewhere in the encounter.   [name] has No Known Allergies. She  reports that she has never smoked. She has never used smokeless tobacco. She reports that she does not drink alcohol or use illicit drugs. She  has no sexual activity history on file. The patient  has past surgical history that includes Abdominoplasty (2003); Colonoscopy (06/2014); and saline infusion [place].  Her family history includes CAD (age of onset: 61) in her mother; Cancer in her maternal grandfather; Cancer (age of onset: 81) in her maternal grandmother; Colon polyps in her father; Diabetes in her mother; Hypertension in her mother; Kidney disease in her mother; Ovarian cancer in her maternal grandmother. There is no history of Colon cancer, Rectal cancer, or Stomach cancer.  Review of Systems  Constitutional: Negative for fever and malaise/fatigue.  Respiratory: Negative for shortness of breath.   Cardiovascular: Negative for chest pain.  Gastrointestinal: Negative for nausea.  Skin: Positive for itching and rash.  Neurological: Negative for dizziness and headaches.    OBJECTIVE  Her  height is 5\' 3"  (1.6 m) and weight is 187 lb (84.823 kg). Her oral temperature is 98 F (36.7 C). Her blood pressure is 170/102 and her pulse is 72. Her  respiration is 16 and oxygen saturation is 99%.  The patient's body mass index is 33.13 kg/(m^2).  Physical Exam  Constitutional: She is oriented to person, place, and time. She appears well-nourished. No distress.  HENT:  Head: Normocephalic.  Eyes: EOM are normal.  Neck: Normal range of motion.  Respiratory: Effort normal.  Musculoskeletal: Normal range of motion.  Neurological: She is alert and oriented to person, place, and time. She exhibits normal muscle tone. Coordination normal.  Skin: Rash noted. Rash is maculopapular. There is erythema.     Well demarcated, raised borders with satellite lesions.  Unable to obtain KOH due to ointment on rash.  Psychiatric: She has a normal mood and affect.    Results for orders placed or performed in visit on 03/06/15 (from the past 24 hour(s))  POCT glucose (manual entry)     Status: Abnormal   Collection Time: 03/06/15 11:22 AM  Result Value Ref Range   POC Glucose 103 (A) 70 - 99 mg/dl  POCT glycosylated hemoglobin (Hb A1C)     Status: None   Collection Time: 03/06/15 11:28 AM  Result Value Ref Range   Hemoglobin A1C 5.6     ASSESSMENT & PLAN  Chalise was seen today for redness under breasts.  Diagnoses and all orders for this visit:  Rash, skin -     POCT glucose (manual entry) -     fluconazole (DIFLUCAN) 150 MG tablet; Take 1 tablet (150 mg total) by mouth once. -     miconazole (MICATIN) 2 %  cream; Apply 1 application topically 2 (two) times daily.  Family history of diabetes mellitus (DM) -     POCT glucose (manual entry) -     POCT glycosylated hemoglobin (Hb A1C) -     fluconazole (DIFLUCAN) 150 MG tablet; Take 1 tablet (150 mg total) by mouth once. -     miconazole (MICATIN) 2 % cream; Apply 1 application topically 2 (two) times daily.  Monilial rash -     fluconazole (DIFLUCAN) 150 MG tablet; Take 1 tablet (150 mg total) by mouth once. -     miconazole (MICATIN) 2 % cream; Apply 1 application topically 2 (two)  times daily.

## 2015-04-13 ENCOUNTER — Other Ambulatory Visit: Payer: Self-pay

## 2015-04-20 ENCOUNTER — Encounter: Payer: Self-pay | Admitting: Family Medicine

## 2015-10-28 ENCOUNTER — Emergency Department (HOSPITAL_COMMUNITY): Payer: BLUE CROSS/BLUE SHIELD

## 2015-10-28 ENCOUNTER — Emergency Department (HOSPITAL_COMMUNITY)
Admission: EM | Admit: 2015-10-28 | Discharge: 2015-10-28 | Disposition: A | Discharge status: Home / Self Care | Payer: BLUE CROSS/BLUE SHIELD | Attending: Emergency Medicine | Admitting: Emergency Medicine

## 2015-10-28 ENCOUNTER — Encounter (HOSPITAL_COMMUNITY): Payer: Self-pay | Admitting: *Deleted

## 2015-10-28 DIAGNOSIS — Y9289 Other specified places as the place of occurrence of the external cause: Secondary | ICD-10-CM | POA: Insufficient documentation

## 2015-10-28 DIAGNOSIS — S62651A Nondisplaced fracture of medial phalanx of left index finger, initial encounter for closed fracture: Secondary | ICD-10-CM | POA: Insufficient documentation

## 2015-10-28 DIAGNOSIS — Z23 Encounter for immunization: Secondary | ICD-10-CM | POA: Insufficient documentation

## 2015-10-28 DIAGNOSIS — I10 Essential (primary) hypertension: Secondary | ICD-10-CM | POA: Insufficient documentation

## 2015-10-28 DIAGNOSIS — Y998 Other external cause status: Secondary | ICD-10-CM | POA: Insufficient documentation

## 2015-10-28 DIAGNOSIS — Y9389 Activity, other specified: Secondary | ICD-10-CM | POA: Insufficient documentation

## 2015-10-28 DIAGNOSIS — E669 Obesity, unspecified: Secondary | ICD-10-CM | POA: Insufficient documentation

## 2015-10-28 DIAGNOSIS — S61311A Laceration without foreign body of left index finger with damage to nail, initial encounter: Secondary | ICD-10-CM | POA: Insufficient documentation

## 2015-10-28 DIAGNOSIS — W231XXA Caught, crushed, jammed, or pinched between stationary objects, initial encounter: Secondary | ICD-10-CM | POA: Insufficient documentation

## 2015-10-28 DIAGNOSIS — Z8709 Personal history of other diseases of the respiratory system: Secondary | ICD-10-CM | POA: Insufficient documentation

## 2015-10-28 LAB — I-STAT CHEM 8, ED
BUN: 10 mg/dL (ref 6–20)
CALCIUM ION: 1.14 mmol/L (ref 1.12–1.23)
CHLORIDE: 105 mmol/L (ref 101–111)
Creatinine, Ser: 0.7 mg/dL (ref 0.44–1.00)
Glucose, Bld: 83 mg/dL (ref 65–99)
HEMATOCRIT: 44 % (ref 36.0–46.0)
Hemoglobin: 15 g/dL (ref 12.0–15.0)
POTASSIUM: 3.7 mmol/L (ref 3.5–5.1)
SODIUM: 141 mmol/L (ref 135–145)
TCO2: 24 mmol/L (ref 0–100)

## 2015-10-28 MED ORDER — TETANUS-DIPHTH-ACELL PERTUSSIS 5-2.5-18.5 LF-MCG/0.5 IM SUSP
0.5000 mL | Freq: Once | INTRAMUSCULAR | Status: AC
  Administered 2015-10-28: 0.5 mL via INTRAMUSCULAR
  Filled 2015-10-28: qty 0.5

## 2015-10-28 MED ORDER — CEPHALEXIN 500 MG PO CAPS: 500.0000 mg | ORAL_CAPSULE | Freq: Three times a day (TID) | ORAL | Status: DC

## 2015-10-28 MED ORDER — LIDOCAINE HCL 2 % IJ SOLN
10.0000 mL | Freq: Once | INTRAMUSCULAR | Status: AC
  Administered 2015-10-28: 10 mL
  Filled 2015-10-28: qty 20

## 2015-10-28 MED ORDER — IBUPROFEN 600 MG PO TABS: 600.0000 mg | ORAL_TABLET | Freq: Four times a day (QID) | ORAL | Status: DC | PRN

## 2015-10-28 MED ORDER — CEPHALEXIN 500 MG PO CAPS
500.0000 mg | ORAL_CAPSULE | Freq: Once | ORAL | Status: AC
  Administered 2015-10-28: 500 mg via ORAL
  Filled 2015-10-28: qty 1

## 2015-10-28 NOTE — ED Notes (Addendum)
SUTURE CART AT BS.   MEDICATION AT BS

## 2015-10-28 NOTE — ED Notes (Signed)
Pt reports slamming her L index finger on trunk of her car today.  Mild bleeding noted.

## 2015-10-28 NOTE — ED Notes (Signed)
Pt denies hx of HTN-however, reports her doctor wanted to control her BP with weight and diet but at the time her SBP was 150s.  States it's never been this high.  Pt denies any h/a, or dizziness at this time.

## 2015-10-28 NOTE — ED Provider Notes (Signed)
CSN: [id number]     Arrival date & time 10/28/15  1111 History   First MD Initiated Contact with Patient 10/28/15 1403     Chief Complaint  Patient presents with  . Finger Injury     HPI   Priscilla Lane is an 55 y.o. female who presents to the ED for evaluation of finger injury. SHe states she slammed her left index finger int he trunk of her car earlier today. She reports a laceration with associated bruising and pain in the tip of her left finger. Denies new numbness, weakness, tingling. Denies other injury. States the bleeding is controlled. Denies anticoagulation use. She is unsure of her last tetanus.   Past Medical History  Diagnosis Date  . Allergic rhinitis     pollen  . Hypertension, essential   . Obesity    Past Surgical History  Procedure Laterality Date  . Abdominoplasty  2003  . Colonoscopy  06/2014    polyp - colon tissue, mod diverticulosis, rpt 10 yrs Henrene Pastor)  . Saline infusion [place]      for postmenopausal bleeding/fibroids/thickened endometrium ([place])   Family History  Problem Relation Age of Onset  . Diabetes Mother   . CAD Mother 80    CABG  . Hypertension Mother   . Kidney disease Mother     ESRD on HD  . Cancer Maternal Grandmother 69    GYN  . Ovarian cancer Maternal Grandmother   . Cancer Maternal Grandfather     HEENT  . Colon cancer Neg Hx   . Rectal cancer Neg Hx   . Stomach cancer Neg Hx   . Colon polyps Father    Social History  Substance Use Topics  . Smoking status: Never Smoker   . Smokeless tobacco: Never Used  . Alcohol Use: No   OB History    No data available     Review of Systems  All other systems reviewed and are negative.     Allergies  Review of patient's allergies indicates no known allergies.  Home Medications   Prior to Admission medications   Medication Sig Start Date End Date Taking? Authorizing Provider  amLODipine (NORVASC) 5 MG tablet Take 1 tablet (5 mg total) by mouth daily. Patient not taking:  Reported on 10/28/2015 11/02/14   Ria Bush, MD  cephALEXin (KEFLEX) 500 MG capsule Take 1 capsule (500 mg total) by mouth 3 (three) times daily. 10/28/15   Olivia Canter Sadako Cegielski, PA-C  ibuprofen (ADVIL,MOTRIN) 600 MG tablet Take 1 tablet (600 mg total) by mouth every 6 (six) hours as needed. 10/28/15   Olivia Canter Kalden Wanke, PA-C   BP 185/108 mmHg  Pulse 84  Temp(Src) 98.4 F (36.9 C) (Oral)  Resp 16  Ht 5\' 4"  (1.626 m)  Wt 83.008 kg  BMI 31.40 kg/m2  SpO2 97% Physical Exam  Constitutional: She is oriented to person, place, and time. No distress.  HENT:  Head: Atraumatic.  Right Ear: External ear normal.  Left Ear: External ear normal.  Nose: Nose normal.  Eyes: Conjunctivae are normal. No scleral icterus.  Neck: Normal range of motion. Neck supple.  Cardiovascular: Normal rate and regular rhythm.   Pulmonary/Chest: Effort normal. No respiratory distress. She exhibits no tenderness.  Abdominal: Soft. She exhibits no distension. There is no tenderness.  Musculoskeletal:  Left index finger with 1 cm laceration on distal volar aspect. Also superficial distal dorsal laceration that extends through part of the nail. Bleeding controlle.d no foreign bodies visualized or palpated.  FROM of finger. Minimal bruising at finger tip. Sensation intact. Brisk cap refill.  No other hand or wrist tenderness or abnormality.  Neurological: She is alert and oriented to person, place, and time.  Skin: Skin is warm and dry. She is not diaphoretic.  Psychiatric: She has a normal mood and affect. Her behavior is normal.  Nursing note and vitals reviewed.   Filed Vitals:   10/28/15 1231 10/28/15 1442 10/28/15 1446 10/28/15 1612  BP: 260/102  185/108 165/111  Pulse:   84 76  Temp:      TempSrc:      Resp:   16 14  Height:  5\' 4"  (1.626 m)    Weight:  83.008 kg    SpO2:   97% 100%     ED Course  Procedures (including critical care time)  LACERATION REPAIR Performed by: Delrae Rend Authorized by: Delrae Rend Consent: Verbal consent obtained. Risks and benefits: risks, benefits and alternatives were discussed Consent given by: patient Patient identity confirmed: provided demographic data Prepped and Draped in normal sterile fashion Wound explored  Laceration Location: left index finger  Laceration Length: 1 cm  No Foreign Bodies seen or palpated  Anesthesia: local infiltration  Local anesthetic: lidocaine 2% without epinephrine  Anesthetic total: 5 ml  Irrigation method: syringe Amount of cleaning: standard  Skin closure: 5-0 prolene  Number of sutures: 3  Technique: simple interrupted   Patient tolerance: Patient tolerated the procedure well with no immediate complications.   Labs Review Labs Reviewed  I-STAT CHEM 8, ED    Imaging Review Dg Finger Index Left  10/28/2015  CLINICAL DATA:  55 year old female with a history of finger injury. EXAM: LEFT INDEX FINGER 2+V COMPARISON:  None. FINDINGS: Casting material somewhat obscures bony details. Questionable nondisplaced fracture involving the distal aspect of the middle phalanx of the finger. No other evidence of acute fracture. Soft tissue swelling. 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 IMPRESSION: Questionable nondisplaced fracture involving the distal aspect of the middle phalanx first finger left hand. Soft tissue swelling. Signed, Dulcy Fanny. Earleen Newport, DO [facility] Electronically Signed   By: Corrie Mckusick D.O.   On: 10/28/2015 11:49   I have personally reviewed and evaluated these images and lab results as part of my medical decision-making.   EKG Interpretation None      MDM   Final diagnoses:  Closed nondisplaced fracture of middle phalanx of left index finger, initial encounter  Laceration of left index finger w/o foreign body with damage to nail, initial encounter    Suture of volar laceration to bring wound together and  facilitate better healing. The cutaneous portion of dorsal lac very superficial so left to heal without closure. Discussed with pt that there is a chance the nail would fall off but hopefully will heal given small linear lac. She tolerated lac repair well. Neurovascularly intact. X-ray does show possible nondisplaced fracture of distal middle phalanx. Finger splinted and f/u with hand given. Tdap updated and pt started on course of keflex. She states she is comfortable and declines pain meds at this time. rx given for ibuprofen as needed. Instructed to f/u with hand this week. ER return precautions given.     Anne Ng, PA-C 10/29/15 [id number]  Milton Ferguson, MD 10/30/15 [phone]

## 2015-10-28 NOTE — Discharge Instructions (Signed)
Please call Dr. Levell July office to schedule a follow up appointment. In the meantime take antibiotics as prescribed. You may take ibuprofen as needed for pain. Return to the ER for new or worsening symptoms.   Finger Fracture Fractures of fingers are breaks in the bones of the fingers. There are many types of fractures. There are different ways of treating these fractures. Your health care provider will discuss the best way to treat your fracture. CAUSES Traumatic injury is the main cause of broken fingers. These include:  Injuries while playing sports.  Workplace injuries.  Falls. RISK FACTORS Activities that can increase your risk of finger fractures include:  Sports.  Workplace activities that involve machinery.  A condition called osteoporosis, which can make your bones less dense and cause them to fracture more easily. SIGNS AND SYMPTOMS The main symptoms of a broken finger are pain and swelling within 15 minutes after the injury. Other symptoms include:  Bruising of your finger.  Stiffness of your finger.  Numbness of your finger.  Exposed bones (compound fracture) if the fracture is severe. DIAGNOSIS  The best way to diagnose a broken bone is with X-ray imaging. Additionally, your health care provider will use this X-ray image to evaluate the position of the broken finger bones.  TREATMENT  Finger fractures can be treated with:   Nonreduction--This means the bones are in place. The finger is splinted without changing the positions of the bone pieces. The splint is usually left on for about a week to 10 days. This will depend on your fracture and what your health care provider thinks.  Closed reduction--The bones are put back into position without using surgery. The finger is then splinted.  Open reduction and internal fixation--The fracture site is opened. Then the bone pieces are fixed into place with pins or some type of hardware. This is seldom required. It depends on  the severity of the fracture. HOME CARE INSTRUCTIONS   Follow your health care provider's instructions regarding activities, exercises, and physical therapy.  Only take over-the-counter or prescription medicines for pain, discomfort, or fever as directed by your health care provider. SEEK MEDICAL CARE IF: You have pain or swelling that limits the motion or use of your fingers. SEEK IMMEDIATE MEDICAL CARE IF:  Your finger becomes numb. MAKE SURE YOU:   Understand these instructions.  Will watch your condition.  Will get help right away if you are not doing well or get worse.   This information is not intended to replace advice given to you by your health care provider. Make sure you discuss any questions you have with your health care provider.   Document Released: 10/12/2000 Document Revised: 04/20/2013 Document Reviewed: 02/09/2013 Elsevier Interactive Patient Education [employer].

## 2015-10-30 ENCOUNTER — Ambulatory Visit (INDEPENDENT_AMBULATORY_CARE_PROVIDER_SITE_OTHER): Payer: Self-pay | Admitting: Family Medicine

## 2015-10-30 VITALS — BP 146/72 | HR 85 | Temp 97.9°F | Resp 18 | Ht 63.0 in | Wt 185.0 lb

## 2015-10-30 DIAGNOSIS — T50905A Adverse effect of unspecified drugs, medicaments and biological substances, initial encounter: Secondary | ICD-10-CM

## 2015-10-30 DIAGNOSIS — S61402D Unspecified open wound of left hand, subsequent encounter: Secondary | ICD-10-CM

## 2015-10-30 DIAGNOSIS — L298 Other pruritus: Secondary | ICD-10-CM

## 2015-10-30 DIAGNOSIS — L299 Pruritus, unspecified: Secondary | ICD-10-CM

## 2015-10-30 DIAGNOSIS — R21 Rash and other nonspecific skin eruption: Secondary | ICD-10-CM

## 2015-10-30 DIAGNOSIS — Z881 Allergy status to other antibiotic agents status: Secondary | ICD-10-CM

## 2015-10-30 MED ORDER — DOXYCYCLINE HYCLATE 100 MG PO TABS: 100.0000 mg | ORAL_TABLET | Freq: Two times a day (BID) | ORAL | Status: DC

## 2015-10-30 MED ORDER — PREDNISONE 20 MG PO TABS
ORAL_TABLET | ORAL | Status: DC
Start: 2015-10-30 — End: 2016-05-27

## 2015-10-30 NOTE — Patient Instructions (Addendum)
Stop the cephalexin  If worse at anytime go to the emergency room or return  Take doxycycline 100 mg one twice daily in place of the cephalexin. This should be taken with breakfast and supper. Avoid excessive time in sunlight because you might burn easier.  Take the prednisone to counteract the allergic reaction. Take 3 daily for 2 days, then 2 daily for 2 days, then 1 daily for 2 days. Best taken with food after breakfast.  Take over-the-counter Zyrtec (cetirizine) one daily for itching.    IF you received an x-ray today, you will receive an invoice from [facility]. Please contact [facility] at [phone] with questions or concerns regarding your invoice.   IF you received labwork today, you will receive an invoice from [employer]. Please contact [facility] at [phone] with questions or concerns regarding your invoice.   Our billing staff will not be able to assist you with questions regarding bills from these companies.  You will be contacted with the lab results as soon as they are available. The fastest way to get your results is to activate your My Chart account. Instructions are located on the last page of this paperwork. If you have not heard from [place] regarding the results in 2 weeks, please contact this office.

## 2015-10-30 NOTE — Progress Notes (Signed)
Patient ID: Priscilla Lane, female    DOB: 24-Jun-1961  Age: 55 y.o. MRN: [id number]  Chief Complaint  Patient presents with  . Other    possible medication reaction, some facial swelling    Subjective:   Patient cut and injured her left index finger on the car seat. She had sutures and was placed on an antibiotic in the emergency room 2 days ago. After taking the cephalexin she started developing rash is on her face. It gone down some yesterday with Benadryl but came back worse this morning with puffiness of her eyelids and cheeks and a little itching on her back also. No prior history of drug allergies. She also was taking ibuprofen, but had taking it successfully in the past. The capsule of cephalexin is a large bright red. We Discussed How Sometimes the Dyazide Also Be the Source of Allergy, but You Have To Assume That It Was the Antibiotic.  Current allergies, medications, problem list, past/family and social histories reviewed.  Objective:  BP 146/72 mmHg  Pulse 85  Temp(Src) 97.9 F (36.6 C)  Resp 18  Ht 5\' 3"  (1.6 m)  Wt 185 lb (83.915 kg)  BMI 32.78 kg/m2  SpO2 97%  No major acute distress.  He cheeks and eyelids or face. Minimal rash and upper back. Slightly roughened skin on her back. Chest clear. Heart regular without murmurs. No oral lesions.  Assessment & Plan:   Assessment: 1. Allergy to antibiotic   2. Facial rash   3. Itching due to drug   4. Open wound of hand with complication, left, subsequent encounter       Plan: Cephalexin allergy Wound to finger Facial rash , see instructions.  No orders of the defined types were placed in this encounter.    Meds ordered this encounter  Medications  . predniSONE (DELTASONE) 20 MG tablet    Sig: Take 3 daily for 2 days, then 2 for 2 days, then 1 for 2 days for allergy    Dispense:  12 tablet    Refill:  0  . doxycycline (VIBRA-TABS) 100 MG tablet    Sig: Take 1 tablet (100 mg total) by mouth 2 (two) times  daily.    Dispense:  14 tablet    Refill:  0         Patient Instructions   Stop the cephalexin  If worse at anytime go to the emergency room or return  Take doxycycline 100 mg one twice daily in place of the cephalexin. This should be taken with breakfast and supper. Avoid excessive time in sunlight because you might burn easier.  Take the prednisone to counteract the allergic reaction. Take 3 daily for 2 days, then 2 daily for 2 days, then 1 daily for 2 days. Best taken with food after breakfast.  Take over-the-counter Zyrtec (cetirizine) one daily for itching.    IF you received an x-ray today, you will receive an invoice from [facility]. Please contact [facility] at [phone] with questions or concerns regarding your invoice.   IF you received labwork today, you will receive an invoice from [employer]. Please contact [facility] at [phone] with questions or concerns regarding your invoice.   Our billing staff will not be able to assist you with questions regarding bills from these companies.  You will be contacted with the lab results as soon as they are available. The fastest way to get your results is to activate your My Chart account. Instructions are  located on the last page of this paperwork. If you have not heard from [place] regarding the results in 2 weeks, please contact this office.          No Follow-up on file.   HOPPER,DAVID, MD 10/30/2015

## 2015-10-31 DIAGNOSIS — S61311A Laceration without foreign body of left index finger with damage to nail, initial encounter: Secondary | ICD-10-CM | POA: Insufficient documentation

## 2015-11-05 DIAGNOSIS — S62661D Nondisplaced fracture of distal phalanx of left index finger, subsequent encounter for fracture with routine healing: Secondary | ICD-10-CM | POA: Insufficient documentation

## 2016-05-27 ENCOUNTER — Encounter: Payer: Self-pay | Admitting: Physician Assistant

## 2016-05-27 ENCOUNTER — Ambulatory Visit (INDEPENDENT_AMBULATORY_CARE_PROVIDER_SITE_OTHER): Payer: Self-pay

## 2016-05-27 ENCOUNTER — Ambulatory Visit (INDEPENDENT_AMBULATORY_CARE_PROVIDER_SITE_OTHER): Payer: Self-pay | Admitting: Family Medicine

## 2016-05-27 VITALS — BP 122/72 | HR 95 | Temp 98.6°F | Resp 17 | Ht 63.0 in | Wt 192.0 lb

## 2016-05-27 DIAGNOSIS — M25562 Pain in left knee: Secondary | ICD-10-CM

## 2016-05-27 DIAGNOSIS — M25462 Effusion, left knee: Secondary | ICD-10-CM

## 2016-05-27 MED ORDER — MELOXICAM 7.5 MG PO TABS: 7.5000 mg | ORAL_TABLET | Freq: Every day | ORAL | 0 refills | Status: DC

## 2016-05-27 NOTE — Progress Notes (Signed)
By signing my name below, I, Priscilla Lane, attest that this documentation has been prepared under the direction and in the presence of Priscilla Ray, MD.  Electronically Signed: Verlee Lane, Medical Scribe. 05/27/16. 10:15 AM.  Subjective:    Patient ID: Priscilla Lane, female    DOB: [birth year], 55 y.o.   MRN: [id number]  HPI Chief Complaint  Patient presents with  . Knee Pain    left side     HPI Comments: Priscilla Lane is a 55 y.o. female who presents to the [facility] complaining of left knee pain onset 3 days ago. Pt couldn't bend her leg back when she initially felt her symptoms while walking out the door 3 days ago. Last night, she felt a pop in her knee with secondary pain from ambulaion after turning and twisting. Pt has tried icy-hot and ibuprofen last night without relief to her symptoms. Reports she can still walk around, but has started limping yesterday. Pt has had pain in her right knee that resolved in "a couple of days" a few years ago, but nothing recent. She mentions she started walking more 2 weeks ago. Denies pain in her left knee in the past, recent trauma in her left knee, swelling in her knee, her knee recently giving away, and her knee recently locking up. Denies PMHx of stomach ulcers.  Patient Active Problem List   Diagnosis Date Noted  . Post-menopausal bleeding 11/02/2014  . Health maintenance examination 04/14/2014  . Hand pain 01/12/2014  . Hypertension, essential   . Obesity    Past Medical History:  Diagnosis Date  . Allergic rhinitis    pollen  . Hypertension, essential   . Obesity    Past Surgical History:  Procedure Laterality Date  . ABDOMINOPLASTY  2003  . 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 saline infusion [place]     for postmenopausal bleeding/fibroids/thickened endometrium ([place])   Allergies  Allergen Reactions  . Cephalexin Rash    Taking cephalexin for  2 days and is broken out in a facial rash with swelling of cheeks and eyelids. Incidentally, was also taking some ibuprofen at the same time but she has tolerated that well in the past and it probably is not the allergen.   Prior to Admission medications   Medication Sig Start Date End Date Taking? Authorizing Provider  amLODipine (NORVASC) 5 MG tablet Take 1 tablet (5 mg total) by mouth daily. Patient not taking: Reported on 05/27/2016 11/02/14   Ria Bush, MD   Social History   Social History  . Marital status: Married    Spouse name: N/A  . Number of children: N/A  . Years of education: N/A   Occupational History  . Not on file.   Social History Main Topics  . Smoking status: Never Smoker  . Smokeless tobacco: Never Used  . Alcohol use No  . Drug use: No  . Sexual activity: No   Other Topics Concern  . 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 Negative for joint swelling.   Objective:  Physical Exam  Constitutional: She appears well-developed and well-nourished. No distress.  HENT:  Head: Normocephalic and atraumatic.  Eyes: Conjunctivae are normal.  Neck: Neck supple.  Cardiovascular: Normal rate.   Pulmonary/Chest: Effort normal.  Musculoskeletal:  Slightly warmth of left knee Does have some effusion, but skin intact Held at full extension Guarded with flexion to approx 100 degrees Tenderness along lateral anterior joint line as well as the posterior knee with some fullness on posterior knee Pain with ROM to 90 degrees Neg varus Neg valgus Guarded unable to check [place] Neg Lachman NVI distally  Neurological: She is alert.  Skin: Skin is warm and dry.  Psychiatric: She has  a normal mood and affect. Her behavior is normal.  Nursing note and vitals reviewed.  BP 122/72 (BP Location: Right Arm, Patient Position: Sitting, Cuff Size: Normal)   Pulse 95   Temp 98.6 F (37 C) (Oral)   Resp 17   Ht 5\' 3"  (1.6 m)   Wt 192 lb (87.1 kg)   SpO2 96%   BMI 34.01 kg/m    Dg Knee Complete 4 Views Left  Result Date: 05/27/2016 CLINICAL DATA:  Lateral left knee pain and swelling. EXAM: LEFT KNEE - COMPLETE 4+ VIEW COMPARISON:  Contralateral knee 05/22/2008 FINDINGS: Joint effusion without fracture or subluxation. Mild degenerative marginal spurring with probable medial compartment narrowing. Superior pole patellar enthesophyte. IMPRESSION: 1. Joint effusion without acute osseous finding. 2. Degenerative spurring with probable medial compartment narrowing. Electronically Signed   By: Lane Fantasia M.D.   On: 05/27/2016 10:46   Assessment & Plan:    Priscilla Lane is a 55 y.o. female Pain in lateral portion of left knee - Plan: DG Knee Complete 4 Views Left, Crutches, Apply knee sleeve, meloxicam (MOBIC) 7.5 MG tablet  Swelling of joint of left knee - Plan: DG Knee Complete 4 Views Left, Crutches, Apply knee sleeve, meloxicam (MOBIC) 7.5 MG tablet  Lateral knee pain with worsening overnight after pop. History and exam concerning for possible lateral meniscus tear. No acute concerning findings on x-Lane. Treatment options discussed, decided against injection or aspiration today, but RTC precautions if worsening swelling or pain with movement.  -Start meloxicam 7.5 mg daily. Hinged knee brace, crutches with weightbearing as tolerated and range of motion as tolerated.  -ice, elevation, other symptomatic care discussed  - Recheck within the next 1 week, sooner if worse to decide on advanced imaging with MRI or orthopedic evaluation. Meds ordered this encounter  Medications  . meloxicam (MOBIC) 7.5 MG tablet    Sig: Take 1 tablet (7.5 mg total) by mouth daily.    Dispense:   30 tablet    Refill:  0   Patient Instructions    Based on your knee pain and history, a tear in the meniscus is possible. We can try anti-inflammatory meloxicam once per day, crutches, knee brace for now, ice on and off for 10-15 minutes today and tomorrow, elevate knee when seated. If not improving within the next 1 week, return for recheck and we can consider either an MRI at that time, or referral to orthopedics. Return sooner if worsening.   Knee Pain Knee pain is a very common symptom and can have many causes. Knee pain often goes away when you follow your health care provider's instructions for relieving pain and discomfort at home. However, knee pain can develop into a condition that needs treatment. Some conditions may include:  Arthritis caused by wear and tear (osteoarthritis).  Arthritis caused by swelling and irritation (rheumatoid arthritis or gout).  A cyst or growth in  your knee.  An infection in your knee joint.  An injury that will not heal.  Damage, swelling, or irritation of the tissues that support your knee (torn ligaments or tendinitis). If your knee pain continues, additional tests may be ordered to diagnose your condition. Tests may include X-rays or other imaging studies of your knee. You may also need to have fluid removed from your knee. Treatment for ongoing knee pain depends on the cause, but treatment may include:  Medicines to relieve pain or swelling.  Steroid injections in your knee.  Physical therapy.  Surgery. HOME CARE INSTRUCTIONS  Take medicines only as directed by your health care provider.  Rest your knee and keep it raised (elevated) while you are resting.  Do not do things that cause or worsen pain.  Avoid high-impact activities or exercises, such as running, jumping rope, or doing jumping jacks.  Apply ice to the knee area:  Put ice in a plastic bag.  Place a towel between your skin and the bag.  Leave the ice on for 20  minutes, 2-3 times a day.  Ask your health care provider if you should wear an elastic knee support.  Keep a pillow under your knee when you sleep.  Lose weight if you are overweight. Extra weight can put pressure on your knee.  Do not use any tobacco products, including cigarettes, chewing tobacco, or electronic cigarettes. If you need help quitting, ask your health care provider. Smoking may slow the healing of any bone and joint problems that you may have. SEEK MEDICAL CARE IF:  Your knee pain continues, changes, or gets worse.  You have a fever along with knee pain.  Your knee buckles or locks up.  Your knee becomes more swollen. SEEK IMMEDIATE MEDICAL CARE IF:   Your knee joint feels hot to the touch.  You have chest pain or trouble breathing. This information is not intended to replace advice given to you by your health care provider. Make sure you discuss any questions you have with your health care provider. Document Released: 04/27/2007 Document Revised: 07/21/2014 Document Reviewed: 02/13/2014 Elsevier Interactive Patient Education  2017 [employer].    IF you received an x-Lane today, you will receive an invoice from [facility]. Please contact [facility] at [phone] with questions or concerns regarding your invoice.   IF you received labwork today, you will receive an invoice from [employer]. Please contact [facility] at [phone] with questions or concerns regarding your invoice.   Our billing staff will not be able to assist you with questions regarding bills from these companies.  You will be contacted with the lab results as soon as they are available. The fastest way to get your results is to activate your My Chart account. Instructions are located on the last page of this paperwork. If you have not heard from [place] regarding the results in 2 weeks, please contact this office.        I personally performed the  services described in this documentation, which was scribed in my presence. The recorded information has been reviewed and considered, and addended by me as needed.   Signed,   Priscilla Ray, MD [facility].  05/27/16 11:00 AM

## 2016-05-27 NOTE — Patient Instructions (Addendum)
Based on your knee pain and history, a tear in the meniscus is possible. We can try anti-inflammatory meloxicam once per day, crutches, knee brace for now, ice on and off for 10-15 minutes today and tomorrow, elevate knee when seated. If not improving within the next 1 week, return for recheck and we can consider either an MRI at that time, or referral to orthopedics. Return sooner if worsening.   Knee Pain Knee pain is a very common symptom and can have many causes. Knee pain often goes away when you follow your health care provider's instructions for relieving pain and discomfort at home. However, knee pain can develop into a condition that needs treatment. Some conditions may include:  Arthritis caused by wear and tear (osteoarthritis).  Arthritis caused by swelling and irritation (rheumatoid arthritis or gout).  A cyst or growth in your knee.  An infection in your knee joint.  An injury that will not heal.  Damage, swelling, or irritation of the tissues that support your knee (torn ligaments or tendinitis). If your knee pain continues, additional tests may be ordered to diagnose your condition. Tests may include X-rays or other imaging studies of your knee. You may also need to have fluid removed from your knee. Treatment for ongoing knee pain depends on the cause, but treatment may include:  Medicines to relieve pain or swelling.  Steroid injections in your knee.  Physical therapy.  Surgery. HOME CARE INSTRUCTIONS  Take medicines only as directed by your health care provider.  Rest your knee and keep it raised (elevated) while you are resting.  Do not do things that cause or worsen pain.  Avoid high-impact activities or exercises, such as running, jumping rope, or doing jumping jacks.  Apply ice to the knee area:  Put ice in a plastic bag.  Place a towel between your skin and the bag.  Leave the ice on for 20 minutes, 2-3 times a day.  Ask your health care provider  if you should wear an elastic knee support.  Keep a pillow under your knee when you sleep.  Lose weight if you are overweight. Extra weight can put pressure on your knee.  Do not use any tobacco products, including cigarettes, chewing tobacco, or electronic cigarettes. If you need help quitting, ask your health care provider. Smoking may slow the healing of any bone and joint problems that you may have. SEEK MEDICAL CARE IF:  Your knee pain continues, changes, or gets worse.  You have a fever along with knee pain.  Your knee buckles or locks up.  Your knee becomes more swollen. SEEK IMMEDIATE MEDICAL CARE IF:   Your knee joint feels hot to the touch.  You have chest pain or trouble breathing. This information is not intended to replace advice given to you by your health care provider. Make sure you discuss any questions you have with your health care provider. Document Released: 04/27/2007 Document Revised: 07/21/2014 Document Reviewed: 02/13/2014 Elsevier Interactive Patient Education  2017 [employer].    IF you received an x-ray today, you will receive an invoice from [facility]. Please contact [facility] at [phone] with questions or concerns regarding your invoice.   IF you received labwork today, you will receive an invoice from [employer]. Please contact [facility] at [phone] with questions or concerns regarding your invoice.   Our billing staff will not be able to assist you with questions regarding bills from these companies.  You will be contacted with  the lab results as soon as they are available. The fastest way to get your results is to activate your My Chart account. Instructions are located on the last page of this paperwork. If you have not heard from [place] regarding the results in 2 weeks, please contact this office.

## 2016-06-02 ENCOUNTER — Ambulatory Visit (INDEPENDENT_AMBULATORY_CARE_PROVIDER_SITE_OTHER): Payer: Self-pay | Admitting: Family Medicine

## 2016-06-02 VITALS — BP 120/84 | HR 86 | Temp 98.2°F | Resp 18 | Ht 63.0 in | Wt 190.4 lb

## 2016-06-02 DIAGNOSIS — M25562 Pain in left knee: Secondary | ICD-10-CM

## 2016-06-02 NOTE — Patient Instructions (Addendum)
As your knee pain is improving, we can try to treat with home exercises for a possible meniscus tear. As we discussed, an MRI is typically needed to make that diagnosis, but your symptoms are suspicious for and as it is improving, can treat with exercises at this time. If you are not continuing to improve in the next 2 weeks, or the swelling or soreness worsens during that time, let me know and I will refer you to an orthopedist.  Return to the clinic or go to the nearest emergency room if any of your symptoms worsen or new symptoms occur.   Meniscus Tear, Phase I Rehab Ask your health care provider which exercises are safe for you. Do exercises exactly as told by your health care provider and adjust them as directed. It is normal to feel mild stretching, pulling, tightness, or discomfort as you do these exercises, but you should stop right away if you feel sudden pain or your pain gets worse.Do not begin these exercises until told by your health care provider. Stretching and range of motion exercises These exercises warm up your muscles and joints and improve the movement and flexibility of your knee. These exercises also help to relieve pain and stiffness. Exercise A: Knee flexion, active 1. Lie on your back with both knees straight. If this causes back discomfort, bend your uninjured knee so your foot is flat on the floor. 2. Slowly slide your left / right heel back toward your buttocks until you feel a gentle stretch in the front of your knee or thigh. Stop if you have pain. 3. Hold for __________ seconds. 4. Slowly slide your left / right heel back to the starting position. Repeat __________ times. Complete this exercise __________ times a day. Exercise B: Knee extension, sitting 1. Sit with your left / right heel propped on a chair, a coffee table, or a footstool. Do not have anything under your knee to support it. 2. Allow your leg muscles to relax, letting gravity straighten out your  knee. You should feel a stretch behind your left / right knee. 3. If told by your health care provider, deepen the stretch by placing a __________ weight on your thigh, just above your kneecap. 4. Hold this position for __________ seconds. Repeat __________ times. Complete this stretch __________ times a day. Strengthening exercises These exercises build strength and endurance in your knee. Endurance is the ability to use your muscles for a long time, even after they get tired. Exercise C: Quadriceps, isometric 1. Lie on your back with your left / right leg extended and your other knee bent. Put a rolled towel or small pillow under your right/left knee if told by your health care provider. 2. Slowly tense the muscles in the front of your left / right thigh by pushing the back of your knee down. You should see your knee cap slide up toward your hip or see increased dimpling just above the knee. 3. For __________ seconds, keep the muscle as tight as you can without increasing your pain. 4. Relax the muscles slowly and completely. Repeat __________ times. Complete this exercise __________ times a day. Exercise D: Straight leg raises (quadriceps) 1. Lie on your back with your left / right leg extended and your other knee bent. 2. Tense the muscles in the front of your left / right thigh. You should see your kneecap slide up or see increased dimpling just above the knee. 3. Keep these muscles tight as you raise your  leg 4-6 inches (10-15 cm) off the floor. 4. Hold this position for __________ seconds. 5. Keep these muscles tense as you lower your leg. 6. Relax the muscles slowly and completely. Repeat __________ times. Complete this exercise __________ times a day. Exercise E: Hamstring curls 1. On the floor or a bed, lie on your abdomen with your legs straight. Put a folded towel or small pillow under your left / right thigh, just above your kneecap. 2. Slowly bend your left / right knee as far as  you can without pain. Keep your hips flat against the floor or bed. 3. Hold this position for __________ seconds. 4. Slowly lower your leg to the starting position. Repeat __________ times. Complete this exercise __________ times a day. This information is not intended to replace advice given to you by your health care provider. Make sure you discuss any questions you have with your health care provider. Document Released: 07/14/1998 Document Revised: 03/04/2016 Document Reviewed: 05/06/2015 Elsevier Interactive Patient Education  2017 [employer].      IF you received an x-ray today, you will receive an invoice from [facility]. Please contact [facility] at [phone] with questions or concerns regarding your invoice.   IF you received labwork today, you will receive an invoice from [employer]. Please contact [facility] at [phone] with questions or concerns regarding your invoice.   Our billing staff will not be able to assist you with questions regarding bills from these companies.  You will be contacted with the lab results as soon as they are available. The fastest way to get your results is to activate your My Chart account. Instructions are located on the last page of this paperwork. If you have not heard from [place] regarding the results in 2 weeks, please contact this office.

## 2016-06-02 NOTE — Progress Notes (Signed)
Subjective:  By signing my name below, I, Priscilla Lane, attest that this documentation has been prepared under the direction and in the presence of Wendie Agreste, MD Electronically Signed: Ladene Artist, ED Scribe 06/02/2016 at 10:12 AM.   Patient ID: Priscilla Lane, female    DOB: 10-27-1960, 55 y.o.   MRN: [id number]  Chief Complaint  Patient presents with  . Follow-up    for left knee pain    HPI  HPI Comments: Priscilla Lane is a 55 y.o. female who presents to the [facility] for follow-up of left knee pain. She was seen 6 days ago. She had a pop with difficulty of bending left knee 3 days prior. Suspicious for lateral mensicus tear. Negative XR. Started Mobic 7.5 mg TD. Crutches and knee brace. Today, pt states that left knee pain has improved. She has tried bending the knee without the knee brace at bedtime only. Pt has tried Mobic, icing and Tylenol with significant relief. She denies locking or giving away of her left knee.  Patient Active Problem List   Diagnosis Date Noted  . Post-menopausal bleeding 11/02/2014  . Health maintenance examination 04/14/2014  . Hand pain 01/12/2014  . Hypertension, essential   . Obesity    Past Medical History:  Diagnosis Date  . Allergic rhinitis    pollen  . Hypertension, essential   . Obesity    Past Surgical History:  Procedure Laterality Date  . ABDOMINOPLASTY  2003  . 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 saline infusion [place]     for postmenopausal bleeding/fibroids/thickened endometrium ([place])   Allergies  Allergen Reactions  . Cephalexin Rash    Taking cephalexin for 2 days and is broken out in a facial rash with swelling of cheeks and eyelids. Incidentally, was also taking some ibuprofen at the same time but she has tolerated that well in the past and it probably is not the allergen.   Prior to Admission medications   Medication Sig Start Date End  Date Taking? Authorizing Provider  meloxicam (MOBIC) 7.5 MG tablet Take 1 tablet (7.5 mg total) by mouth daily. 05/27/16  Yes Wendie Agreste, MD   Social History   Social History  . Marital status: Married    Spouse name: N/A  . Number of children: N/A  . Years of education: N/A   Occupational History  . Not on file.   Social History Main Topics  . Smoking status: Never Smoker  . Smokeless tobacco: Never Used  . Alcohol use No  . Drug use: No  . Sexual activity: No   Other Topics Concern  . 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   BP 120/84   Pulse 86   Temp 98.2 F (36.8 C) (Oral)   Resp 18   Ht 5\' 3"  (1.6 m)   Wt 190 lb 6.4 oz (86.4 kg)   SpO2 97%   BMI 33.73 kg/m     Objective:   Physical Exam  Constitutional: She is oriented to person, place, and time. She appears well-developed and well-nourished. No distress.  HENT:  Head: Normocephalic and atraumatic.  Eyes: Conjunctivae  and EOM are normal.  Neck: Neck supple. No tracheal deviation present.  Cardiovascular: Normal rate.   Pulmonary/Chest: Effort normal. No respiratory distress.  Musculoskeletal: Normal range of motion.  L knee: Skin intact. No erythema or rash. Minimal effusion. Flexion to 80 degrees. Full extension. Positive crepitus laterally. Minimal discomfort with [place].   Neurological: She is alert and oriented to person, place, and time.  Skin: Skin is warm and dry.  Psychiatric: She has a normal mood and affect. Her behavior is normal.  Nursing note and vitals reviewed.     Assessment & Plan:   Priscilla Lane is a 55 y.o. female Pain in lateral portion of left knee  - still possible lateral meniscus tear, but much  improved.   - trial of home exercises, brace if needed and if not improving in next 2 weeks, can refer to ortho.  No orders of the defined types were placed in this encounter.  Patient Instructions    As your knee pain is improving, we can try to treat with home exercises for a possible meniscus tear. As we discussed, an MRI is typically needed to make that diagnosis, but your symptoms are suspicious for and as it is improving, can treat with exercises at this time. If you are not continuing to improve in the next 2 weeks, or the swelling or soreness worsens during that time, let me know and I will refer you to an orthopedist.  Return to the clinic or go to the nearest emergency room if any of your symptoms worsen or new symptoms occur.   Meniscus Tear, Phase I Rehab Ask your health care provider which exercises are safe for you. Do exercises exactly as told by your health care provider and adjust them as directed. It is normal to feel mild stretching, pulling, tightness, or discomfort as you do these exercises, but you should stop right away if you feel sudden pain or your pain gets worse.Do not begin these exercises until told by your health care provider. Stretching and range of motion exercises These exercises warm up your muscles and joints and improve the movement and flexibility of your knee. These exercises also help to relieve pain and stiffness. Exercise A: Knee flexion, active 1. Lie on your back with both knees straight. If this causes back discomfort, bend your uninjured knee so your foot is flat on the floor. 2. Slowly slide your left / right heel back toward your buttocks until you feel a gentle stretch in the front of your knee or thigh. Stop if you have pain. 3. Hold for __________ seconds. 4. Slowly slide your left / right heel back to the starting position. Repeat __________ times. Complete this exercise __________ times a day. Exercise B: Knee extension, sitting 1. Sit  with your left / right heel propped on a chair, a coffee table, or a footstool. Do not have anything under your knee to support it. 2. Allow your leg muscles to relax, letting gravity straighten out your knee. You should feel a stretch behind your left / right knee. 3. If told by your health care provider, deepen the stretch by placing a __________ weight on your thigh, just above your kneecap. 4. Hold this position for __________ seconds. Repeat __________ times. Complete this stretch __________ times a day. Strengthening exercises These exercises build strength and endurance in your knee. Endurance is the ability to use your muscles for a long time, even after they get tired. Exercise C: Quadriceps, isometric 1. Shanda Howells  on your back with your left / right leg extended and your other knee bent. Put a rolled towel or small pillow under your right/left knee if told by your health care provider. 2. Slowly tense the muscles in the front of your left / right thigh by pushing the back of your knee down. You should see your knee cap slide up toward your hip or see increased dimpling just above the knee. 3. For __________ seconds, keep the muscle as tight as you can without increasing your pain. 4. Relax the muscles slowly and completely. Repeat __________ times. Complete this exercise __________ times a day. Exercise D: Straight leg raises (quadriceps) 1. Lie on your back with your left / right leg extended and your other knee bent. 2. Tense the muscles in the front of your left / right thigh. You should see your kneecap slide up or see increased dimpling just above the knee. 3. Keep these muscles tight as you raise your leg 4-6 inches (10-15 cm) off the floor. 4. Hold this position for __________ seconds. 5. Keep these muscles tense as you lower your leg. 6. Relax the muscles slowly and completely. Repeat __________ times. Complete this exercise __________ times a day. Exercise E: Hamstring curls 1. On  the floor or a bed, lie on your abdomen with your legs straight. Put a folded towel or small pillow under your left / right thigh, just above your kneecap. 2. Slowly bend your left / right knee as far as you can without pain. Keep your hips flat against the floor or bed. 3. Hold this position for __________ seconds. 4. Slowly lower your leg to the starting position. Repeat __________ times. Complete this exercise __________ times a day. This information is not intended to replace advice given to you by your health care provider. Make sure you discuss any questions you have with your health care provider. Document Released: 07/14/1998 Document Revised: 03/04/2016 Document Reviewed: 05/06/2015 Elsevier Interactive Patient Education  2017 [employer].      IF you received an x-ray today, you will receive an invoice from [facility]. Please contact [facility] at [phone] with questions or concerns regarding your invoice.   IF you received labwork today, you will receive an invoice from [employer]. Please contact [facility] at [phone] with questions or concerns regarding your invoice.   Our billing staff will not be able to assist you with questions regarding bills from these companies.  You will be contacted with the lab results as soon as they are available. The fastest way to get your results is to activate your My Chart account. Instructions are located on the last page of this paperwork. If you have not heard from [place] regarding the results in 2 weeks, please contact this office.        I personally performed the services described in this documentation, which was scribed in my presence. The recorded information has been reviewed and considered, and addended by me as needed.   Signed,   [name], MD [facility].  06/02/16 11:05 PM

## 2016-06-04 ENCOUNTER — Encounter: Payer: Self-pay | Admitting: Family Medicine

## 2016-06-04 ENCOUNTER — Ambulatory Visit (INDEPENDENT_AMBULATORY_CARE_PROVIDER_SITE_OTHER): Payer: Self-pay | Admitting: Family Medicine

## 2016-06-04 DIAGNOSIS — R030 Elevated blood-pressure reading, without diagnosis of hypertension: Secondary | ICD-10-CM

## 2016-06-04 DIAGNOSIS — M62838 Other muscle spasm: Secondary | ICD-10-CM

## 2016-06-04 MED ORDER — CYCLOBENZAPRINE HCL 5 MG PO TABS: 5.0000 mg | ORAL_TABLET | Freq: Three times a day (TID) | ORAL | 0 refills | Status: DC | PRN

## 2016-06-04 NOTE — Patient Instructions (Addendum)
     IF you received an x-ray today, you will receive an invoice from [facility]. Please contact [facility] at [phone] with questions or concerns regarding your invoice.   IF you received labwork today, you will receive an invoice from [employer]. Please contact [facility] at [phone] with questions or concerns regarding your invoice.   Our billing staff will not be able to assist you with questions regarding bills from these companies.  You will be contacted with the lab results as soon as they are available. The fastest way to get your results is to activate your My Chart account. Instructions are located on the last page of this paperwork. If you have not heard from [place] regarding the results in 2 weeks, please contact this office.      Muscle Cramps and Spasms Muscle cramps and spasms are when muscles tighten by themselves. They usually get better within minutes. Muscle cramps are painful. They are usually stronger and last longer than muscle spasms. Muscle spasms may or may not be painful. They can last a few seconds or much longer. HOME CARE  Drink enough fluid to keep your pee (urine) clear or pale yellow.  Massage, stretch, and relax the muscle.  Use a warm towel, heating pad, or warm shower water on tight muscles.  Place ice on the muscle if it is tender or in pain.  Put ice in a plastic bag.  Place a towel between your skin and the bag.  Leave the ice on for 15-20 minutes, 3-4 times a day.  Only take medicine as told by your doctor. GET HELP RIGHT AWAY IF:  Your cramps or spasms get worse, happen more often, or do not get better with time. MAKE SURE YOU:  Understand these instructions.  Will watch your condition.  Will get help right away if you are not doing well or get worse. This information is not intended to replace advice given to you by your health care provider. Make sure you discuss any questions you have  with your health care provider. Document Released: 06/12/2008 Document Revised: 10/25/2012 Document Reviewed: 04/03/2015 Elsevier Interactive Patient Education  2017 [employer].
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
## 2016-06-11 ENCOUNTER — Encounter: Payer: Self-pay | Admitting: Family Medicine

## 2016-06-11 ENCOUNTER — Ambulatory Visit (INDEPENDENT_AMBULATORY_CARE_PROVIDER_SITE_OTHER): Payer: Self-pay | Admitting: Family Medicine

## 2016-06-11 DIAGNOSIS — M62838 Other muscle spasm: Secondary | ICD-10-CM

## 2016-06-11 DIAGNOSIS — I10 Essential (primary) hypertension: Secondary | ICD-10-CM

## 2016-06-11 MED ORDER — HYDROCHLOROTHIAZIDE 25 MG PO TABS: 25.0000 mg | ORAL_TABLET | Freq: Every day | ORAL | 3 refills | Status: DC
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
## 2016-06-11 NOTE — Patient Instructions (Addendum)
     IF you received an x-ray today, you will receive an invoice from [facility]. Please contact [facility] at [phone] with questions or concerns regarding your invoice.   IF you received labwork today, you will receive an invoice from [employer]. Please contact [facility] at [phone] with questions or concerns regarding your invoice.   Our billing staff will not be able to assist you with questions regarding bills from these companies.  You will be contacted with the lab results as soon as they are available. The fastest way to get your results is to activate your My Chart account. Instructions are located on the last page of this paperwork. If you have not heard from [place] regarding the results in 2 weeks, please contact this office.      Hypertension Hypertension is another name for high blood pressure. High blood pressure forces your heart to work harder to pump blood. A blood pressure reading has two numbers, which includes a higher number over a lower number (example: 110/72). Follow these instructions at home:  Have your blood pressure rechecked by your doctor.  Only take medicine as told by your doctor. Follow the directions carefully. The medicine does not work as well if you skip doses. Skipping doses also puts you at risk for problems.  Do not smoke.  Monitor your blood pressure at home as told by your doctor. Contact a doctor if:  You think you are having a reaction to the medicine you are taking.  You have repeat headaches or feel dizzy.  You have puffiness (swelling) in your ankles.  You have trouble with your vision. Get help right away if:  You get a very bad headache and are confused.  You feel weak, numb, or faint.  You get chest or belly (abdominal) pain.  You throw up (vomit).  You cannot breathe very well. This information is not intended to replace advice given to you by your health care provider.  Make sure you discuss any questions you have with your health care provider. Document Released: 12/17/2007 Document Revised: 12/06/2015 Document Reviewed: 04/22/2013 Elsevier Interactive Patient Education  2017 [employer].

## 2016-06-18 ENCOUNTER — Telehealth: Payer: Self-pay

## 2016-08-13 ENCOUNTER — Ambulatory Visit (INDEPENDENT_AMBULATORY_CARE_PROVIDER_SITE_OTHER): Payer: Self-pay | Admitting: Family Medicine

## 2016-08-13 ENCOUNTER — Encounter: Payer: Self-pay | Admitting: Family Medicine

## 2016-08-13 VITALS — BP 178/98 | HR 82 | Temp 98.6°F | Resp 16 | Ht 63.0 in | Wt 189.0 lb

## 2016-08-13 DIAGNOSIS — I10 Essential (primary) hypertension: Secondary | ICD-10-CM

## 2016-08-13 DIAGNOSIS — Z6833 Body mass index (BMI) 33.0-33.9, adult: Secondary | ICD-10-CM

## 2016-08-13 DIAGNOSIS — Z13220 Encounter for screening for lipoid disorders: Secondary | ICD-10-CM

## 2016-08-13 DIAGNOSIS — E6609 Other obesity due to excess calories: Secondary | ICD-10-CM

## 2016-08-13 DIAGNOSIS — Z5181 Encounter for therapeutic drug level monitoring: Secondary | ICD-10-CM

## 2016-08-13 MED ORDER — LISINOPRIL-HYDROCHLOROTHIAZIDE 20-12.5 MG PO TABS: 1.0000 | ORAL_TABLET | Freq: Every day | ORAL | 3 refills | Status: DC
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
## 2016-08-13 NOTE — Patient Instructions (Addendum)
[email or web address]   IF you received an x-ray today, you will receive an invoice from [facility]. Please contact [facility] at [phone] with questions or concerns regarding your invoice.   IF you received labwork today, you will receive an invoice from [place]. Please contact [facility] at [phone] with questions or concerns regarding your invoice.   Our billing staff will not be able to assist you with questions regarding bills from these companies.  You will be contacted with the lab results as soon as they are available. The fastest way to get your results is to activate your My Chart account. Instructions are located on the last page of this paperwork. If you have not heard from [place] regarding the results in 2 weeks, please contact this office.      Hypertension Hypertension, commonly called high blood pressure, is when the force of blood pumping through your arteries is too strong. Your arteries are the blood vessels that carry blood from your heart throughout your body. A blood pressure reading consists of a higher number over a lower number, such as 110/72. The higher number (systolic) is the pressure inside your arteries when your heart pumps. The lower number (diastolic) is the pressure inside your arteries when your heart relaxes. Ideally you want your blood pressure below 120/80. Hypertension forces your heart to work harder to pump blood. Your arteries may become narrow or stiff. Having untreated or uncontrolled hypertension can cause heart attack, stroke, kidney disease, and other problems. What increases the risk? Some risk factors for high blood pressure are controllable. Others are not. Risk factors you cannot control include:  Race. You may be at higher risk if you are African American.  Age. Risk increases with age.  Gender. Men are at higher risk than women before age 64 years. After age 50, women are at higher risk than men. Risk factors you can  control include:  Not getting enough exercise or physical activity.  Being overweight.  Getting too much fat, sugar, calories, or salt in your diet.  Drinking too much alcohol. What are the signs or symptoms? Hypertension does not usually cause signs or symptoms. Extremely high blood pressure (hypertensive crisis) may cause headache, anxiety, shortness of breath, and nosebleed. How is this diagnosed? To check if you have hypertension, your health care provider will measure your blood pressure while you are seated, with your arm held at the level of your heart. It should be measured at least twice using the same arm. Certain conditions can cause a difference in blood pressure between your right and left arms. A blood pressure reading that is higher than normal on one occasion does not mean that you need treatment. If it is not clear whether you have high blood pressure, you may be asked to return on a different day to have your blood pressure checked again. Or, you may be asked to monitor your blood pressure at home for 1 or more weeks. How is this treated? Treating high blood pressure includes making lifestyle changes and possibly taking medicine. Living a healthy lifestyle can help lower high blood pressure. You may need to change some of your habits. Lifestyle changes may include:  Following the DASH diet. This diet is high in fruits, vegetables, and whole grains. It is low in salt, red meat, and added sugars.  Keep your sodium intake below 2,300 mg per day.  Getting at least 30-45 minutes of aerobic exercise at least 4 times per week.  Losing weight if necessary.  Not smoking.  Limiting alcoholic beverages.  Learning ways to reduce stress. Your health care provider may prescribe medicine if lifestyle changes are not enough to get your blood pressure under control, and if one of the following is true:  You are 60-26 years of age and your systolic blood pressure is above 140.  You  are 15 years of age or older, and your systolic blood pressure is above 150.  Your diastolic blood pressure is above 90.  You have diabetes, and your systolic blood pressure is over [id number] or your diastolic blood pressure is over 90.  You have kidney disease and your blood pressure is above 140/90.  You have heart disease and your blood pressure is above 140/90. Your personal target blood pressure may vary depending on your medical conditions, your age, and other factors. Follow these instructions at home:  Have your blood pressure rechecked as directed by your health care provider.  Take medicines only as directed by your health care provider. Follow the directions carefully. Blood pressure medicines must be taken as prescribed. The medicine does not work as well when you skip doses. Skipping doses also puts you at risk for problems.  Do not smoke.  Monitor your blood pressure at home as directed by your health care provider. Contact a health care provider if:  You think you are having a reaction to medicines taken.  You have recurrent headaches or feel dizzy.  You have swelling in your ankles.  You have trouble with your vision. Get help right away if:  You develop a severe headache or confusion.  You have unusual weakness, numbness, or feel faint.  You have severe chest or abdominal pain.  You vomit repeatedly.  You have trouble breathing. This information is not intended to replace advice given to you by your health care provider. Make sure you discuss any questions you have with your health care provider. Document Released: 06/30/2005 Document Revised: 12/06/2015 Document Reviewed: 04/22/2013 Elsevier Interactive Patient Education  2017 [employer].

## 2016-08-14 ENCOUNTER — Other Ambulatory Visit: Payer: Self-pay | Admitting: Family Medicine

## 2016-08-14 LAB — BASIC METABOLIC PANEL
BUN / CREAT RATIO: 20 (ref 9–23)
BUN: 14 mg/dL (ref 6–24)
CO2: 26 mmol/L (ref 18–29)
CREATININE: 0.7 mg/dL (ref 0.57–1.00)
Calcium: 10.2 mg/dL (ref 8.7–10.2)
Chloride: 103 mmol/L (ref 96–106)
GFR calc Af Amer: 112 mL/min/{1.73_m2} (ref >59)
GFR calc non Af Amer: 97 mL/min/{1.73_m2} (ref >59)
GLUCOSE: 100 mg/dL — AB (ref 65–99)
Potassium: 4.8 mmol/L (ref 3.5–5.2)
SODIUM: 143 mmol/L (ref 134–144)

## 2016-08-14 LAB — LIPID PANEL
CHOL/HDL RATIO: 4 ratio (ref 0.0–4.4)
Cholesterol, Total: 228 mg/dL — ABNORMAL HIGH (ref 100–199)
HDL: 57 mg/dL (ref >39)
LDL Calculated: 143 mg/dL — ABNORMAL HIGH (ref 0–99)
Triglycerides: 139 mg/dL (ref 0–149)
VLDL CHOLESTEROL CAL: 28 mg/dL (ref 5–40)

## 2016-08-14 MED ORDER — ASPIRIN 81 MG PO TABS: 81.0000 mg | ORAL_TABLET | Freq: Every day | ORAL | 3 refills | Status: AC

## 2016-08-14 MED ORDER — ATORVASTATIN CALCIUM 20 MG PO TABS: 20.0000 mg | ORAL_TABLET | Freq: Every day | ORAL | 3 refills | Status: DC

## 2016-09-10 ENCOUNTER — Ambulatory Visit (INDEPENDENT_AMBULATORY_CARE_PROVIDER_SITE_OTHER): Payer: Self-pay | Admitting: Family Medicine

## 2016-09-10 ENCOUNTER — Encounter: Payer: Self-pay | Admitting: Family Medicine

## 2016-09-10 VITALS — BP 146/85 | HR 83 | Temp 98.6°F | Resp 18 | Ht 63.0 in | Wt 182.2 lb

## 2016-09-10 DIAGNOSIS — Z6833 Body mass index (BMI) 33.0-33.9, adult: Secondary | ICD-10-CM

## 2016-09-10 DIAGNOSIS — E6609 Other obesity due to excess calories: Secondary | ICD-10-CM

## 2016-09-10 DIAGNOSIS — I10 Essential (primary) hypertension: Secondary | ICD-10-CM

## 2016-09-10 NOTE — Patient Instructions (Addendum)
We recommend that you schedule a mammogram for breast cancer screening. Typically, you do not need a referral to do this. Please contact a local imaging center to schedule your mammogram.  [facility] - [phone]  *ask for the Radiology Department [facility]) - [phone] or [phone]  [facility] - [phone] [facility] - [phone] [facility] - [phone]  *ask for the Radiology Department [facility] - [phone]  *ask for the Radiology Department [facility] - [phone]  *ask for the Mammography Department [facility] - [phone]     IF you received an x-ray today, you will receive an invoice from [facility]. Please contact [facility] at [phone] with questions or concerns regarding your invoice.   IF you received labwork today, you will receive an invoice from [facility]. Please contact [facility] at [phone] with questions or concerns regarding your invoice.   Our billing staff will not be able to assist you with questions regarding bills from these companies.  You will be contacted with the lab results as soon as they are available. The fastest way to get your results is to activate your My Chart account. Instructions are located on the last page of this paperwork. If you have not heard from us regarding the results in 2 weeks, please contact this office.      

## 2016-09-10 NOTE — Progress Notes (Signed)
Chief Complaint  Patient presents with  . Follow-up    Blood pressure    HPI Hypertension: Patient here for follow-up of elevated blood pressure. She is exercising and is not adherent to low salt diet.  Blood pressure is well controlled at home. Cardiac symptoms none. Patient denies chest pain, claudication, fatigue and palpitations.  Cardiovascular risk factors: hypertension and obesity (BMI >= 30 kg/m2). Use of agents associated with hypertension: none. History of target organ damage: none. BP Readings from Last 3 Encounters:  09/10/16 (!) 146/85  08/13/16 (!) 178/98  06/11/16 (!) 154/92   Obesity Pt reports that she has been exercising by walking She is avoiding fatty foods and salty foods She is also drinking less sugary beverages Body mass index is 32.28 kg/m. Wt Readings from Last 3 Encounters:  09/10/16 182 lb 3.2 oz (82.6 kg)  08/13/16 189 lb (85.7 kg)  06/11/16 190 lb 3.2 oz (86.3 kg)    Past Medical History:  Diagnosis Date  . Allergic rhinitis    pollen  . Hypertension, essential   . Obesity     Current Outpatient Prescriptions  Medication Sig Dispense Refill  . aspirin 81 MG tablet Take 1 tablet (81 mg total) by mouth daily. 90 tablet 3  . atorvastatin (LIPITOR) 20 MG tablet Take 1 tablet (20 mg total) by mouth daily. 90 tablet 3  . hydrochlorothiazide (HYDRODIURIL) 25 MG tablet Take 1 tablet (25 mg total) by mouth daily. 30 tablet 3  . lisinopril-hydrochlorothiazide (ZESTORETIC) 20-12.5 MG tablet Take 1 tablet by mouth daily. 30 tablet 3  . meloxicam (MOBIC) 7.5 MG tablet Take 1 tablet (7.5 mg total) by mouth daily. 30 tablet 0   No current facility-administered medications for this visit.     Allergies:  Allergies  Allergen Reactions  . Cephalexin Rash    Taking cephalexin for 2 days and is broken out in a facial rash with swelling of cheeks and eyelids. Incidentally, was also taking some ibuprofen at the same time but she has tolerated that well in the  past and it probably is not the allergen.    Past Surgical History:  Procedure Laterality Date  . ABDOMINOPLASTY  2003  . 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 saline infusion [place]     for postmenopausal bleeding/fibroids/thickened endometrium ([place])    Social History   Social History  . Marital status: Married    Spouse name: N/A  . Number of children: N/A  . Years of education: N/A   Social History Main Topics  . Smoking status: Never Smoker  . Smokeless tobacco: Never Used  . Alcohol use No  . Drug use: No  . Sexual activity: No   Other Topics Concern  . 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  Respiratory: Negative for cough, hemoptysis and wheezing.   Cardiovascular: Negative for chest pain, palpitations and orthopnea.  Gastrointestinal: Negative for abdominal pain, nausea and vomiting.  Neurological: Negative for dizziness, tingling and headaches.  Psychiatric/Behavioral: Negative for depression. The patient is not nervous/anxious and does not have insomnia.     Objective: Vitals:   09/10/16 0834  BP: (!) 146/85  Pulse: 83  Resp: 18  Temp: 98.6 F (37 C)  TempSrc: Oral  SpO2: 98%  Weight: 182 lb 3.2 oz (82.6 kg)  Height: 5\' 3"  (1.6 m)    Physical Exam  Constitutional: She is oriented to person, place, and time. She appears well-developed and well-nourished.  HENT:  Head: Normocephalic and atraumatic.  Eyes: Conjunctivae and EOM are normal.  Cardiovascular: Normal rate, regular rhythm and normal heart sounds.   No murmur heard. Pulmonary/Chest: Effort normal and breath sounds normal. No respiratory distress. She has no  wheezes.  Neurological: She is alert and oriented to person, place, and time.  Skin: Skin is warm. No erythema.  Psychiatric: She has a normal mood and affect. Her behavior is normal. Judgment and thought content normal.    Assessment and Plan Cand[place] was seen today for follow-up.  Diagnoses and all orders for this visit:  Uncontrolled hypertension- bp is improving but is not at goal of <140/90 Follow up in 3 months DASH diet encouraged  Class 1 obesity due to excess calories with serious comorbidity and body mass index (BMI) of 33.0 to 33.9 in adult - improving with exercise    Jonerik Sliker A Nolon Rod

## 2016-11-28 ENCOUNTER — Ambulatory Visit (INDEPENDENT_AMBULATORY_CARE_PROVIDER_SITE_OTHER): Payer: Self-pay

## 2016-11-28 ENCOUNTER — Ambulatory Visit (INDEPENDENT_AMBULATORY_CARE_PROVIDER_SITE_OTHER): Payer: Self-pay | Admitting: Physician Assistant

## 2016-11-28 ENCOUNTER — Encounter: Payer: Self-pay | Admitting: Physician Assistant

## 2016-11-28 VITALS — BP 128/80 | HR 86 | Temp 98.0°F | Resp 17 | Ht 62.0 in | Wt 176.0 lb

## 2016-11-28 DIAGNOSIS — M545 Low back pain, unspecified: Secondary | ICD-10-CM

## 2016-11-28 DIAGNOSIS — M5417 Radiculopathy, lumbosacral region: Secondary | ICD-10-CM

## 2016-11-28 MED ORDER — HYDROCODONE-ACETAMINOPHEN 5-325 MG PO TABS: 1.0000 | ORAL_TABLET | Freq: Four times a day (QID) | ORAL | 0 refills | Status: DC | PRN

## 2016-11-28 MED ORDER — MELOXICAM 15 MG PO TABS: 15.0000 mg | ORAL_TABLET | Freq: Every day | ORAL | 0 refills | Status: DC
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
## 2016-11-28 NOTE — Patient Instructions (Addendum)
Please take medication as prescribed.  Do not use mobic or naproxen with this.  You can take tylenol I would like you to ice the low back three times per week for 15 minutes.  Back Pain, Adult Back pain is very common. The pain often gets better over time. The cause of back pain is usually not dangerous. Most people can learn to manage their back pain on their own. Follow these instructions at home: Watch your back pain for any changes. The following actions may help to lessen any pain you are feeling:  Stay active. Start with short walks on flat ground if you can. Try to walk farther each day.  Exercise regularly as told by your doctor. Exercise helps your back heal faster. It also helps avoid future injury by keeping your muscles strong and flexible.  Do not sit, drive, or stand in one place for more than 30 minutes.  Do not stay in bed. Resting more than 1-2 days can slow down your recovery.  Be careful when you bend or lift an object. Use good form when lifting:  Bend at your knees.  Keep the object close to your body.  Do not twist.  Sleep on a firm mattress. Lie on your side, and bend your knees. If you lie on your back, put a pillow under your knees.  Take medicines only as told by your doctor.  Put ice on the injured area.  Put ice in a plastic bag.  Place a towel between your skin and the bag.  Leave the ice on for 20 minutes, 2-3 times a day for the first 2-3 days. After that, you can switch between ice and heat packs.  Avoid feeling anxious or stressed. Find good ways to deal with stress, such as exercise.  Maintain a healthy weight. Extra weight puts stress on your back. Contact a doctor if:  You have pain that does not go away with rest or medicine.  You have worsening pain that goes down into your legs or buttocks.  You have pain that does not get better in one week.  You have pain at night.  You lose weight.  You have a fever or chills. Get help  right away if:  You cannot control when you poop (bowel movement) or pee (urinate).  Your arms or legs feel weak.  Your arms or legs lose feeling (numbness).  You feel sick to your stomach (nauseous) or throw up (vomit).  You have belly (abdominal) pain.  You feel like you may pass out (faint). This information is not intended to replace advice given to you by your health care provider. Make sure you discuss any questions you have with your health care provider. Document Released: 12/17/2007 Document Revised: 12/06/2015 Document Reviewed: 11/01/2013 Elsevier Interactive Patient Education  2017 [employer].     IF you received an x-ray today, you will receive an invoice from [facility]. Please contact [facility] at [phone] with questions or concerns regarding your invoice.   IF you received labwork today, you will receive an invoice from [facility]. Please contact [facility] at [phone] with questions or concerns regarding your invoice.   Our billing staff will not be able to assist you with questions regarding bills from these companies.  You will be contacted with the lab results as soon as they are available. The fastest way to get your results is to activate your My Chart account. Instructions are located on the last page of this paperwork. If  you have not heard from [place] regarding the results in 2 weeks, please contact this office.

## 2016-12-10 ENCOUNTER — Other Ambulatory Visit: Payer: Self-pay | Admitting: Family Medicine

## 2016-12-10 ENCOUNTER — Ambulatory Visit (INDEPENDENT_AMBULATORY_CARE_PROVIDER_SITE_OTHER): Payer: Self-pay | Admitting: Family Medicine

## 2016-12-10 ENCOUNTER — Ambulatory Visit: Payer: Self-pay | Admitting: Family Medicine

## 2016-12-10 ENCOUNTER — Encounter: Payer: Self-pay | Admitting: Family Medicine

## 2016-12-10 VITALS — BP 130/76 | HR 78 | Temp 98.0°F | Resp 18 | Ht 62.0 in | Wt 174.0 lb

## 2016-12-10 DIAGNOSIS — I10 Essential (primary) hypertension: Secondary | ICD-10-CM

## 2016-12-10 DIAGNOSIS — R252 Cramp and spasm: Secondary | ICD-10-CM

## 2016-12-10 DIAGNOSIS — R058 Other specified cough: Secondary | ICD-10-CM

## 2016-12-10 DIAGNOSIS — R05 Cough: Secondary | ICD-10-CM

## 2016-12-10 MED ORDER — FLUTICASONE PROPIONATE 50 MCG/ACT NA SUSP: 2.0000 | Freq: Every day | NASAL | 6 refills | Status: DC

## 2016-12-10 MED ORDER — CETIRIZINE HCL 10 MG PO TABS: 10.0000 mg | ORAL_TABLET | Freq: Every day | ORAL | 11 refills | Status: DC

## 2016-12-10 NOTE — Telephone Encounter (Signed)
See 12/10/16 office visit note.

## 2016-12-10 NOTE — Progress Notes (Signed)
Chief Complaint  Patient presents with  . Blood pressure elevated    HPI   Cough Pt reports a cough that is dry for 2-3 months now Seems worse at night Nonproductive No wheezing or shortness of breath No postnasal drip She started lisinopril-hctz Jan 2018 but was previously on hctz She is not taking any antihistamines  Hypertension: Patient here for follow-up of elevated blood pressure. She is exercising and is adherent to low salt diet.  Blood pressure is well controlled at home. Cardiac symptoms none. Patient denies chest pain, claudication, dyspnea, fatigue, irregular heart beat and lower extremity edema.  Cardiovascular risk factors: hypertension. Use of agents associated with hypertension: none. History of target organ damage: none.  Lab Results  Component Value Date   CREATININE 0.70 08/13/2016    BP Readings from Last 3 Encounters:  12/10/16 130/76  11/28/16 128/80  09/10/16 (!) 146/85    Muscle cramps Gets muscle cramps at night in her thighs Relieved by ice States that she can see the muscle contracting Denies dehydration   Past Medical History:  Diagnosis Date  . Allergic rhinitis    pollen  . Hypertension, essential   . Obesity     Current Outpatient Prescriptions  Medication Sig Dispense Refill  . aspirin 81 MG tablet Take 1 tablet (81 mg total) by mouth daily. 90 tablet 3  . lisinopril-hydrochlorothiazide (ZESTORETIC) 20-12.5 MG tablet Take 1 tablet by mouth daily. 30 tablet 3  . atorvastatin (LIPITOR) 20 MG tablet Take 1 tablet (20 mg total) by mouth daily. (Patient not taking: Reported on 11/28/2016) 90 tablet 3  . cetirizine (ZYRTEC) 10 MG tablet Take 1 tablet (10 mg total) by mouth daily. 30 tablet 11  . fluticasone (FLONASE) 50 MCG/ACT nasal spray Place 2 sprays into both nostrils daily. 16 g 6   No current facility-administered medications for this visit.     Allergies:  Allergies  Allergen Reactions  . Cephalexin Rash    Taking  cephalexin for 2 days and is broken out in a facial rash with swelling of cheeks and eyelids. Incidentally, was also taking some ibuprofen at the same time but she has tolerated that well in the past and it probably is not the allergen.    Past Surgical History:  Procedure Laterality Date  . ABDOMINOPLASTY  2003  . 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 saline infusion [place]     for postmenopausal bleeding/fibroids/thickened endometrium ([place])    Social History   Social History  . Marital status: Married    Spouse name: N/A  . Number of children: N/A  . Years of education: N/A   Social History Main Topics  . Smoking status: Never Smoker  . Smokeless tobacco: Never Used  . Alcohol use No  . Drug use: No  . Sexual activity: No   Other Topics Concern  . 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 She appears well-developed and well-nourished.  HENT:  Head: Normocephalic and atraumatic.  Right Ear: External ear normal.  Left Ear: External ear normal.  Nose: Nose normal.  Mouth/Throat: Oropharynx is clear and moist. No oropharyngeal exudate.  Mild edema of nasal mucosa  Eyes: Conjunctivae and EOM are normal.  Cardiovascular: Normal rate, regular rhythm and normal heart sounds.   No murmur heard. Pulmonary/Chest: Effort normal and breath sounds normal. No respiratory distress. She has no wheezes.  Neurological: She is alert and oriented to person, place, and time.    Skin: Skin is warm. Capillary refill takes less than 2 seconds.  Psychiatric: She has a normal mood and affect. Her behavior is normal. Judgment and thought content normal.    Assessment and Plan Crystina was seen today for blood pressure elevated.  Diagnoses and all orders for this visit:  Hypertension, essential- well controlled today -     Basic metabolic panel  Muscle cramps- will check for restless leg syndrome Could also be a sign of potassium derangement -     Ferritin -     CBC  Dry cough Advised flonase and zyrtec first  If no improvement then hold the lisinopril-hctz and takes any remaining hctz from previous prescription If off Lisinopril for one week and symptoms still remain then she will need evaluation for reflux or trial of PPI -     fluticasone (FLONASE) 50 MCG/ACT nasal spray; Place 2 sprays into both nostrils daily. -     cetirizine (ZYRTEC) 10 MG tablet; Take 1 tablet (10 mg total) by mouth daily.     [place]

## 2016-12-10 NOTE — Patient Instructions (Addendum)
Drug holiday  Stop your blood pressure medication for one week and see if the cough goes away If the cough stops then call the clinic and I will change your medication   Stop the lisinopril- hctz for one week If you have hydrochlorothiazide at home then take it during the week      IF you received an x-ray today, you will receive an invoice from [facility]. Please contact [facility] at [phone] with questions or concerns regarding your invoice.   IF you received labwork today, you will receive an invoice from [place]. Please contact [facility] at [phone] with questions or concerns regarding your invoice.   Our billing staff will not be able to assist you with questions regarding bills from these companies.  You will be contacted with the lab results as soon as they are available. The fastest way to get your results is to activate your My Chart account. Instructions are located on the last page of this paperwork. If you have not heard from [place] regarding the results in 2 weeks, please contact this office.

## 2016-12-11 LAB — CBC
HEMATOCRIT: 34.8 % (ref 34.0–46.6)
HEMOGLOBIN: 11.5 g/dL (ref 11.1–15.9)
MCH: 30 pg (ref 26.6–33.0)
MCHC: 33 g/dL (ref 31.5–35.7)
MCV: 91 fL (ref 79–97)
Platelets: 227 10*3/uL (ref 150–379)
RBC: 3.83 x10E6/uL (ref 3.77–5.28)
RDW: 14.4 % (ref 12.3–15.4)
WBC: 4.8 10*3/uL (ref 3.4–10.8)

## 2016-12-11 LAB — BASIC METABOLIC PANEL
BUN/Creatinine Ratio: 24 — ABNORMAL HIGH (ref 9–23)
BUN: 19 mg/dL (ref 6–24)
CALCIUM: 9.9 mg/dL (ref 8.7–10.2)
CO2: 23 mmol/L (ref 18–29)
Chloride: 100 mmol/L (ref 96–106)
Creatinine, Ser: 0.79 mg/dL (ref 0.57–1.00)
GFR calc Af Amer: 96 mL/min/{1.73_m2} (ref >59)
GFR calc non Af Amer: 83 mL/min/{1.73_m2} (ref >59)
Glucose: 95 mg/dL (ref 65–99)
Potassium: 4.5 mmol/L (ref 3.5–5.2)
SODIUM: 139 mmol/L (ref 134–144)

## 2016-12-11 LAB — FERRITIN: Ferritin: 248 ng/mL — ABNORMAL HIGH (ref 15–150)

## 2016-12-17 ENCOUNTER — Other Ambulatory Visit: Payer: Self-pay | Admitting: Family Medicine

## 2016-12-17 MED ORDER — HYDROCHLOROTHIAZIDE 25 MG PO TABS: 25.0000 mg | ORAL_TABLET | Freq: Every day | ORAL | 1 refills | Status: DC

## 2017-03-10 NOTE — Progress Notes (Signed)
Chief Complaint  Patient presents with  . Hypertension    3 month f/u    HPI   Hypertension: Patient here for follow-up of elevated blood pressure. She is exercising and is not adherent to low salt diet. She has been eating more hot dogs and hamburgers and recently went on vacation with family. Blood pressure is well controlled at home. Cardiac symptoms none. Patient denies chest pain, chest pressure/discomfort, dyspnea, exertional chest pressure/discomfort, lower extremity edema and palpitations.  Cardiovascular risk factors: dyslipidemia and hypertension. Use of agents associated with hypertension: none. History of target organ damage: none.She reports that her cough and muscle cramps stopped when she stopped lisinopril. BP Readings from Last 3 Encounters:  03/11/17 136/87  12/10/16 130/76  11/28/16 128/80    Obesity- she reports that she gained some weight on vacation but is a very active grandmother She is eating more processed meats like hot dogs Wt Readings from Last 3 Encounters:  03/11/17 181 lb 12.8 oz (82.5 kg)  12/10/16 174 lb (78.9 kg)  11/28/16 176 lb (79.8 kg)  Body mass index is 33.25 kg/m.    Past Medical History:  Diagnosis Date  . Allergic rhinitis    pollen  . Hypertension, essential   . Obesity     Current Outpatient Prescriptions  Medication Sig Dispense Refill  . aspirin 81 MG tablet Take 1 tablet (81 mg total) by mouth daily. 90 tablet 3  . atorvastatin (LIPITOR) 20 MG tablet Take 1 tablet (20 mg total) by mouth daily. 90 tablet 3  . cetirizine (ZYRTEC) 10 MG tablet Take 1 tablet (10 mg total) by mouth daily. 30 tablet 11  . fluticasone (FLONASE) 50 MCG/ACT nasal spray Place 2 sprays into both nostrils daily. 16 g 6  . hydrochlorothiazide (HYDRODIURIL) 25 MG tablet Take 1 tablet (25 mg total) by mouth daily. 90 tablet 1   No current facility-administered medications for this visit.     Allergies:  Allergies  Allergen Reactions  . Amlodipine     Lower extremity edema  . Lisinopril     Cough, muscle cramps  . Cephalexin Rash    Taking cephalexin for 2 days and is broken out in a facial rash with swelling of cheeks and eyelids. Incidentally, was also taking some ibuprofen at the same time but she has tolerated that well in the past and it probably is not the allergen.    Past Surgical History:  Procedure Laterality Date  . ABDOMINOPLASTY  2003  . 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 saline infusion [place]     for postmenopausal bleeding/fibroids/thickened endometrium ([place])    Social History   Social History  . Marital status: Married    Spouse name: N/A  . Number of children: N/A  . Years of education: N/A   Social History Main Topics  . Smoking status: Never Smoker  . Smokeless tobacco: Never Used  . Alcohol use No  . Drug use: No  . Sexual activity: No   Other Topics Concern  . 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 She appears well-developed and well-nourished.  HENT:  Head: Normocephalic and atraumatic.  Eyes: Conjunctivae and EOM are normal.  Cardiovascular: Normal rate, regular rhythm and normal heart sounds.   Pulmonary/Chest: Effort normal and breath sounds normal. No respiratory distress. She has no wheezes.  Extremities: normal skin, cap refill <2s, no edema Neurological: She is alert and oriented to person, place, and time.    Assessment and Plan Mae was seen today for hypertension.  Diagnoses and all orders for this visit:  Hypertension, essential- BP stable Will monitor K and renal function -     Basic metabolic panel  Class 1 obesity due to excess calories with serious comorbidity and body mass index (BMI) of 33.0 to 33.9 in adult- deteriorated Discussed the various ways to improve weight  Behavioral modification discussed -     Basic metabolic panel  Encounter for medication monitoring -     Basic metabolic panel  Other orders -     Cancel: MM Digital Screening; Future -     Cancel: Flu Vaccine QUAD 36+ mos IM  -   Chantilly Linskey A Daking Westervelt

## 2017-03-11 ENCOUNTER — Ambulatory Visit (INDEPENDENT_AMBULATORY_CARE_PROVIDER_SITE_OTHER): Payer: Self-pay | Admitting: Family Medicine

## 2017-03-11 ENCOUNTER — Encounter: Payer: Self-pay | Admitting: Family Medicine

## 2017-03-11 VITALS — BP 136/87 | HR 75 | Temp 98.1°F | Resp 18 | Ht 62.0 in | Wt 181.8 lb

## 2017-03-11 DIAGNOSIS — Z6833 Body mass index (BMI) 33.0-33.9, adult: Secondary | ICD-10-CM

## 2017-03-11 DIAGNOSIS — Z5181 Encounter for therapeutic drug level monitoring: Secondary | ICD-10-CM

## 2017-03-11 DIAGNOSIS — I10 Essential (primary) hypertension: Secondary | ICD-10-CM

## 2017-03-11 DIAGNOSIS — E6609 Other obesity due to excess calories: Secondary | ICD-10-CM

## 2017-03-11 NOTE — Patient Instructions (Addendum)
We recommend that you schedule a mammogram for breast cancer screening. Typically, you do not need a referral to do this. Please contact a local imaging center to schedule your mammogram.  [facility] - [phone]  *ask for the Radiology Department [facility] ([place]) - [phone] or [phone]  [facility] - [phone] [place] [phone] [facility] - [phone]  *ask for the [facility] - [phone]  *ask for the Radiology Department [facility] - [phone]  *ask for the [place] - [phone]    IF you received an x-ray today, you will receive an invoice from [facility]. Please contact [facility] at [phone] with questions or concerns regarding your invoice.   IF you received labwork today, you will receive an invoice from [facility]. Please contact [facility] at [phone] with questions or concerns regarding your invoice.   Our billing staff will not be able to assist you with questions regarding bills from these companies.  You will be contacted with the lab results as soon as they are available. The fastest way to get your results is to activate your My Chart account. Instructions are located on the last page of this paperwork. If you have not heard from [place] regarding the results in 2 weeks, please contact this office.     DASH Eating Plan DASH stands for "Dietary Approaches to Stop Hypertension." The DASH eating plan is a healthy eating plan that has been shown to reduce high blood pressure (hypertension). It may also reduce your risk for type 2 diabetes, heart disease, and stroke. The DASH eating plan may also help with weight loss. What are tips for following this plan? General guidelines  Avoid eating more than 2,300 mg (milligrams) of salt (sodium) a day. If you have  hypertension, you may need to reduce your sodium intake to 1,500 mg a day.  Limit alcohol intake to no more than 1 drink a day for nonpregnant women and 2 drinks a day for men. One drink equals 12 oz of beer, 5 oz of wine, or 1 oz of hard liquor.  Work with your health care provider to maintain a healthy body weight or to lose weight. Ask what an ideal weight is for you.  Get at least 30 minutes of exercise that causes your heart to beat faster (aerobic exercise) most days of the week. Activities may include walking, swimming, or biking.  Work with your health care provider or diet and nutrition specialist (dietitian) to adjust your eating plan to your individual calorie needs. Reading food labels  Check food labels for the amount of sodium per serving. Choose foods with less than 5 percent of the Daily Value of sodium. Generally, foods with less than 300 mg of sodium per serving fit into this eating plan.  To find whole grains, look for the word "whole" as the first word in the ingredient list. Shopping  Buy products labeled as "low-sodium" or "no salt added."  Buy fresh foods. Avoid canned foods and premade or frozen meals. Cooking  Avoid adding salt when cooking. Use salt-free seasonings or herbs instead of table salt or sea salt. Check with your health care provider or pharmacist before using salt substitutes.  Do not fry foods. Cook foods using healthy methods such as baking, boiling, grilling, and broiling instead.  Cook with heart-healthy oils, such as olive, canola, soybean, or sunflower oil. Meal  planning   Eat a balanced diet that includes: ? 5 or more servings of fruits and vegetables each day. At each meal, try to fill half of your plate with fruits and vegetables. ? Up to 6-8 servings of whole grains each day. ? Less than 6 oz of lean meat, poultry, or fish each day. A 3-oz serving of meat is about the same size as a deck of cards. One egg equals 1 oz. ? 2 servings of  low-fat dairy each day. ? A serving of nuts, seeds, or beans 5 times each week. ? Heart-healthy fats. Healthy fats called Omega-3 fatty acids are found in foods such as flaxseeds and coldwater fish, like sardines, salmon, and mackerel.  Limit how much you eat of the following: ? Canned or prepackaged foods. ? Food that is high in trans fat, such as fried foods. ? Food that is high in saturated fat, such as fatty meat. ? Sweets, desserts, sugary drinks, and other foods with added sugar. ? Full-fat dairy products.  Do not salt foods before eating.  Try to eat at least 2 vegetarian meals each week.  Eat more home-cooked food and less restaurant, buffet, and fast food.  When eating at a restaurant, ask that your food be prepared with less salt or no salt, if possible. What foods are recommended? The items listed may not be a complete list. Talk with your dietitian about what dietary choices are best for you. Grains Whole-grain or whole-wheat bread. Whole-grain or whole-wheat pasta. Brown rice. [place]. Bulgur. Whole-grain and low-sodium cereals. Pita bread. Low-fat, low-sodium crackers. Whole-wheat flour tortillas. Vegetables Fresh or frozen vegetables (raw, steamed, roasted, or grilled). Low-sodium or reduced-sodium tomato and vegetable juice. Low-sodium or reduced-sodium tomato sauce and tomato paste. Low-sodium or reduced-sodium canned vegetables. Fruits All fresh, dried, or frozen fruit. Canned fruit in natural juice (without added sugar). Meat and other protein foods Skinless chicken or [place]. Ground chicken or [place]. Pork with fat trimmed off. Fish and seafood. Egg whites. Dried beans, peas, or lentils. Unsalted nuts, nut butters, and seeds. Unsalted canned beans. Lean cuts of beef with fat trimmed off. Low-sodium, lean deli meat. Dairy Low-fat (1%) or fat-free (skim) milk. Fat-free, low-fat, or reduced-fat cheeses. Nonfat, low-sodium ricotta or cottage cheese. Low-fat or  nonfat yogurt. Low-fat, low-sodium cheese. Fats and oils Soft margarine without trans fats. Vegetable oil. Low-fat, reduced-fat, or light mayonnaise and salad dressings (reduced-sodium). Canola, safflower, olive, soybean, and sunflower oils. Avocado. Seasoning and other foods Herbs. Spices. Seasoning mixes without salt. Unsalted popcorn and pretzels. Fat-free sweets. What foods are not recommended? The items listed may not be a complete list. Talk with your dietitian about what dietary choices are best for you. Grains Baked goods made with fat, such as croissants, muffins, or some breads. Dry pasta or rice meal packs. Vegetables Creamed or fried vegetables. Vegetables in a cheese sauce. Regular canned vegetables (not low-sodium or reduced-sodium). Regular canned tomato sauce and paste (not low-sodium or reduced-sodium). Regular tomato and vegetable juice (not low-sodium or reduced-sodium). Angie Fava. Olives. Fruits Canned fruit in a light or heavy syrup. Fried fruit. Fruit in cream or butter sauce. Meat and other protein foods Fatty cuts of meat. Ribs. Fried meat. Berniece Salines. Sausage. Bologna and other processed lunch meats. Salami. Fatback. Hotdogs. Bratwurst. Salted nuts and seeds. Canned beans with added salt. Canned or smoked fish. Whole eggs or egg yolks. Chicken or [place] with skin. Dairy Whole or 2% milk, cream, and half-and-half. Whole or full-fat cream cheese. Whole-fat  or sweetened yogurt. Full-fat cheese. Nondairy creamers. Whipped toppings. Processed cheese and cheese spreads. Fats and oils Butter. Stick margarine. Lard. Shortening. Ghee. Bacon fat. Tropical oils, such as coconut, palm kernel, or palm oil. Seasoning and other foods Salted popcorn and pretzels. Onion salt, garlic salt, seasoned salt, table salt, and sea salt. Worcestershire sauce. Tartar sauce. Barbecue sauce. Teriyaki sauce. Soy sauce, including reduced-sodium. Steak sauce. Canned and packaged gravies. Fish sauce. Oyster  sauce. Cocktail sauce. Horseradish that you find on the shelf. Ketchup. Mustard. Meat flavorings and tenderizers. Bouillon cubes. Hot sauce and Tabasco sauce. Premade or packaged marinades. Premade or packaged taco seasonings. Relishes. Regular salad dressings. Where to find more information:  National Heart, Lung, and [place]: https://wilson-eaton.com/  American Heart Association: www.heart.org Summary  The DASH eating plan is a healthy eating plan that has been shown to reduce high blood pressure (hypertension). It may also reduce your risk for type 2 diabetes, heart disease, and stroke.  With the DASH eating plan, you should limit salt (sodium) intake to 2,300 mg a day. If you have hypertension, you may need to reduce your sodium intake to 1,500 mg a day.  When on the DASH eating plan, aim to eat more fresh fruits and vegetables, whole grains, lean proteins, low-fat dairy, and heart-healthy fats.  Work with your health care provider or diet and nutrition specialist (dietitian) to adjust your eating plan to your individual calorie needs. This information is not intended to replace advice given to you by your health care provider. Make sure you discuss any questions you have with your health care provider. Document Released: 06/19/2011 Document Revised: 06/23/2016 Document Reviewed: 06/23/2016 Elsevier Interactive Patient Education  2017 [employer].

## 2017-03-12 LAB — BASIC METABOLIC PANEL
BUN/Creatinine Ratio: 22 (ref 9–23)
BUN: 16 mg/dL (ref 6–24)
CALCIUM: 9.8 mg/dL (ref 8.7–10.2)
CO2: 21 mmol/L (ref 20–29)
CREATININE: 0.73 mg/dL (ref 0.57–1.00)
Chloride: 103 mmol/L (ref 96–106)
GFR calc Af Amer: 106 mL/min/{1.73_m2} (ref >59)
GFR, EST NON AFRICAN AMERICAN: 92 mL/min/{1.73_m2} (ref >59)
GLUCOSE: 91 mg/dL (ref 65–99)
POTASSIUM: 4.2 mmol/L (ref 3.5–5.2)
SODIUM: 142 mmol/L (ref 134–144)

## 2017-03-13 ENCOUNTER — Encounter: Payer: Self-pay | Admitting: Family Medicine

## 2017-06-05 NOTE — Progress Notes (Signed)
Chief Complaint  Patient presents with  . Hypertension  . Follow-up  . Medication Refill    HYdrodiuril 25 MG    HPI  Hypertension: Patient here for follow-up of elevated blood pressure. She is exercising and is adherent to low salt diet.  Blood pressure is well controlled at home. Cardiac symptoms none. Patient denies chest pain, chest pressure/discomfort, fatigue and lower extremity edema.  Cardiovascular risk factors: diabetes mellitus, dyslipidemia and hypertension. Use of agents associated with hypertension: none. History of target organ damage: none. Her muscle cramps have resolved.  BP Readings from Last 3 Encounters:  06/08/17 132/90  03/11/17 136/87  12/10/16 130/76    Pt reports that she has been exercising and her weight has been holding steady.  She walks regularly. Wt Readings from Last 3 Encounters:  06/08/17 182 lb 12.8 oz (82.9 kg)  03/11/17 181 lb 12.8 oz (82.5 kg)  12/10/16 174 lb (78.9 kg)      Past Medical History:  Diagnosis Date  . Allergic rhinitis    pollen  . Hypertension, essential   . Obesity     Current Outpatient Medications  Medication Sig Dispense Refill  . aspirin 81 MG tablet Take 1 tablet (81 mg total) by mouth daily. 90 tablet 3  . atorvastatin (LIPITOR) 20 MG tablet Take 1 tablet (20 mg total) by mouth daily. 90 tablet 3  . cetirizine (ZYRTEC) 10 MG tablet Take 1 tablet (10 mg total) by mouth daily. 30 tablet 11  . fluticasone (FLONASE) 50 MCG/ACT nasal spray Place 2 sprays into both nostrils daily. 16 g 6  . hydrochlorothiazide (HYDRODIURIL) 25 MG tablet Take 1 tablet (25 mg total) by mouth daily. 90 tablet 1   No current facility-administered medications for this visit.     Allergies:  Allergies  Allergen Reactions  . Amlodipine     Lower extremity edema  . Lisinopril     Cough, muscle cramps  . Cephalexin Rash    Taking cephalexin for 2 days and is broken out in a facial rash with swelling of cheeks and eyelids.  Incidentally, was also taking some ibuprofen at the same time but she has tolerated that well in the past and it probably is not the allergen.    Past Surgical History:  Procedure Laterality Date  . ABDOMINOPLASTY  2003  . 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 saline infusion [place]     for postmenopausal bleeding/fibroids/thickened endometrium ([place])    Social History   Socioeconomic History  . Marital status: Married    Spouse name: None  . Number of children: None  . Years of education: None  . Highest education level: None  Social Needs  . Financial resource strain: None  . Food insecurity - worry: None  . Food insecurity - inability: None  . Transportation needs - medical: None  . Transportation needs - non-medical: None  Occupational History  . None  Tobacco Use  . Smoking status: Never Smoker  . Smokeless tobacco: Never Used  Substance and Sexual Activity  . Alcohol use: No    Alcohol/week: 0.0 oz  . Drug use: No  . Sexual activity: No  Other Topics Concern  . 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 Diabetes Mother   . CAD Mother 53       CABG  . Hypertension Mother   . Kidney disease Mother        ESRD on HD  . Colon polyps Father   . Cancer Maternal Grandmother 42       GYN  . Ovarian cancer Maternal Grandmother   . Cancer Maternal Grandfather        HEENT  . Colon cancer Neg Hx   . Rectal cancer Neg Hx   . Stomach cancer Neg Hx      ROS Review of Systems See HPI Constitution: No fevers or chills No malaise No diaphoresis Skin: No rash or itching Eyes: no blurry vision, no double vision GU: no dysuria or hematuria Neuro: no dizziness or  headaches all others reviewed and negative   Objective: Vitals:   06/08/17 0923  BP: 132/90  Pulse: 82  Resp: 18  Temp: 98.6 F (37 C)  TempSrc: Oral  SpO2: 97%  Weight: 182 lb 12.8 oz (82.9 kg)  Height: 5\' 2"  (1.575 m)    Physical Exam  Constitutional: She is oriented to person, place, and time. She appears well-developed and well-nourished.  HENT:  Head: Normocephalic and atraumatic.  Eyes: Conjunctivae and EOM are normal.  Cardiovascular: Normal rate, regular rhythm and normal heart sounds.  No murmur heard. Pulmonary/Chest: Effort normal and breath sounds normal. No stridor. No respiratory distress.  Neurological: She is alert and oriented to person, place, and time.  Skin: Skin is warm. Capillary refill takes less than 2 seconds.  Psychiatric: She has a normal mood and affect. Her behavior is normal. Judgment and thought content normal.    Assessment and Plan Saga was seen today for hypertension, follow-up and medication refill.  Diagnoses and all orders for this visit:  Hypertension, essential- bp still at goal on   Class 1 obesity due to excess calories with serious comorbidity and body mass index (BMI) of 33.0 to 33.9 in adult- advised pt to continue exercise  Muscle cramps- resolved with stopping lisinopril  Need for mammogram for breast cancer screening- pt is self pay. Gave information for [facility] Need for vaccination against influenza- pt self- pay, will get vaccine at local pharmacy due to cost -     Cancel: Flu Vaccine QUAD 36+ mos IM -     MM Digital Screening; Future     Jerrald Doverspike A Nolon Rod

## 2017-06-08 ENCOUNTER — Encounter: Payer: Self-pay | Admitting: Family Medicine

## 2017-06-08 ENCOUNTER — Ambulatory Visit (INDEPENDENT_AMBULATORY_CARE_PROVIDER_SITE_OTHER): Payer: Self-pay | Admitting: Family Medicine

## 2017-06-08 ENCOUNTER — Other Ambulatory Visit: Payer: Self-pay

## 2017-06-08 VITALS — BP 132/90 | HR 82 | Temp 98.6°F | Resp 18 | Ht 62.0 in | Wt 182.8 lb

## 2017-06-08 DIAGNOSIS — I10 Essential (primary) hypertension: Secondary | ICD-10-CM

## 2017-06-08 DIAGNOSIS — E6609 Other obesity due to excess calories: Secondary | ICD-10-CM

## 2017-06-08 DIAGNOSIS — R252 Cramp and spasm: Secondary | ICD-10-CM

## 2017-06-08 DIAGNOSIS — Z6833 Body mass index (BMI) 33.0-33.9, adult: Secondary | ICD-10-CM

## 2017-06-08 MED ORDER — HYDROCHLOROTHIAZIDE 25 MG PO TABS: 25.0000 mg | ORAL_TABLET | Freq: Every day | ORAL | 1 refills | Status: DC

## 2017-06-08 NOTE — Patient Instructions (Addendum)
We recommend that you schedule a mammogram for breast cancer screening. Typically, you do not need a referral to do this. Please contact a local imaging center to schedule your mammogram.  [facility] - [phone]  *ask for the Radiology Department [facility]) - [phone] or [phone]  [facility] - [phone] [facility] - [phone] [facility] - [phone]  *ask for the Radiology Department [facility] - [phone]  *ask for the Radiology Department [facility] - [phone]  *ask for the Mammography Department [facility] - [phone]     IF you received an x-ray today, you will receive an invoice from [facility]. Please contact  Radiology at [phone] with questions or concerns regarding your invoice.   IF you received labwork today, you will receive an invoice from [facility]. Please contact [facility] at [phone] with questions or concerns regarding your invoice.   Our billing staff will not be able to assist you with questions regarding bills from these companies.  You will be contacted with the lab results as soon as they are available. The fastest way to get your results is to activate your My Chart account. Instructions are located on the last page of this paperwork. If you have not heard from us regarding the results in 2 weeks, please contact this office.      

## 2017-10-14 ENCOUNTER — Encounter: Payer: Self-pay | Admitting: Physician Assistant

## 2017-12-09 ENCOUNTER — Ambulatory Visit: Payer: Self-pay | Admitting: Family Medicine

## 2017-12-19 ENCOUNTER — Ambulatory Visit: Payer: Self-pay | Admitting: Family Medicine

## 2018-01-02 ENCOUNTER — Ambulatory Visit (INDEPENDENT_AMBULATORY_CARE_PROVIDER_SITE_OTHER): Payer: Self-pay | Admitting: Emergency Medicine

## 2018-01-02 ENCOUNTER — Other Ambulatory Visit: Payer: Self-pay

## 2018-01-02 ENCOUNTER — Encounter: Payer: Self-pay | Admitting: Family Medicine

## 2018-01-02 VITALS — BP 132/80 | HR 75 | Temp 98.4°F | Resp 17 | Ht 62.0 in | Wt 186.0 lb

## 2018-01-02 DIAGNOSIS — Z1231 Encounter for screening mammogram for malignant neoplasm of breast: Secondary | ICD-10-CM

## 2018-01-02 DIAGNOSIS — I10 Essential (primary) hypertension: Secondary | ICD-10-CM

## 2018-01-02 DIAGNOSIS — Z1239 Encounter for other screening for malignant neoplasm of breast: Secondary | ICD-10-CM

## 2018-01-02 MED ORDER — HYDROCHLOROTHIAZIDE 25 MG PO TABS: 25.0000 mg | ORAL_TABLET | Freq: Every day | ORAL | 3 refills | Status: DC

## 2018-01-02 NOTE — Patient Instructions (Addendum)
   IF you received an x-ray today, you will receive an invoice from [facility]. Please contact [facility] at [phone] with questions or concerns regarding your invoice.   IF you received labwork today, you will receive an invoice from [facility]. Please contact [facility] at [phone] with questions or concerns regarding your invoice.   Our billing staff will not be able to assist you with questions regarding bills from these companies.  You will be contacted with the lab results as soon as they are available. The fastest way to get your results is to activate your My Chart account. Instructions are located on the last page of this paperwork. If you have not heard from us regarding the results in 2 weeks, please contact this office.     Hypertension Hypertension, commonly called high blood pressure, is when the force of blood pumping through the arteries is too strong. The arteries are the blood vessels that carry blood from the heart throughout the body. Hypertension forces the heart to work harder to pump blood and may cause arteries to become narrow or stiff. Having untreated or uncontrolled hypertension can cause heart attacks, strokes, kidney disease, and other problems. A blood pressure reading consists of a higher number over a lower number. Ideally, your blood pressure should be below 120/80. The first ("top") number is called the systolic pressure. It is a measure of the pressure in your arteries as your heart beats. The second ("bottom") number is called the diastolic pressure. It is a measure of the pressure in your arteries as the heart relaxes. What are the causes? The cause of this condition is not known. What increases the risk? Some risk factors for high blood pressure are under your control. Others are not. Factors you can change  Smoking.  Having type 2 diabetes mellitus, high cholesterol, or both.  Not getting enough exercise or physical  activity.  Being overweight.  Having too much fat, sugar, calories, or salt (sodium) in your diet.  Drinking too much alcohol. Factors that are difficult or impossible to change  Having chronic kidney disease.  Having a family history of high blood pressure.  Age. Risk increases with age.  Race. You may be at higher risk if you are African-American.  Gender. Men are at higher risk than women before age 45. After age 65, women are at higher risk than men.  Having obstructive sleep apnea.  Stress. What are the signs or symptoms? Extremely high blood pressure (hypertensive crisis) may cause:  Headache.  Anxiety.  Shortness of breath.  Nosebleed.  Nausea and vomiting.  Severe chest pain.  Jerky movements you cannot control (seizures).  How is this diagnosed? This condition is diagnosed by measuring your blood pressure while you are seated, with your arm resting on a surface. The cuff of the blood pressure monitor will be placed directly against the skin of your upper arm at the level of your heart. It should be measured at least twice using the same arm. Certain conditions can cause a difference in blood pressure between your right and left arms. Certain factors can cause blood pressure readings to be lower or higher than normal (elevated) for a short period of time:  When your blood pressure is higher when you are in a health care provider's office than when you are at home, this is called white coat hypertension. Most people with this condition do not need medicines.  When your blood pressure is higher at home than when you   are in a health care provider's office, this is called masked hypertension. Most people with this condition may need medicines to control blood pressure.  If you have a high blood pressure reading during one visit or you have normal blood pressure with other risk factors:  You may be asked to return on a different day to have your blood pressure  checked again.  You may be asked to monitor your blood pressure at home for 1 week or longer.  If you are diagnosed with hypertension, you may have other blood or imaging tests to help your health care provider understand your overall risk for other conditions. How is this treated? This condition is treated by making healthy lifestyle changes, such as eating healthy foods, exercising more, and reducing your alcohol intake. Your health care provider may prescribe medicine if lifestyle changes are not enough to get your blood pressure under control, and if:  Your systolic blood pressure is above 130.  Your diastolic blood pressure is above 80.  Your personal target blood pressure may vary depending on your medical conditions, your age, and other factors. Follow these instructions at home: Eating and drinking  Eat a diet that is high in fiber and potassium, and low in sodium, added sugar, and fat. An example eating plan is called the DASH (Dietary Approaches to Stop Hypertension) diet. To eat this way: ? Eat plenty of fresh fruits and vegetables. Try to fill half of your plate at each meal with fruits and vegetables. ? Eat whole grains, such as whole wheat pasta, brown rice, or whole grain bread. Fill about one quarter of your plate with whole grains. ? Eat or drink low-fat dairy products, such as skim milk or low-fat yogurt. ? Avoid fatty cuts of meat, processed or cured meats, and poultry with skin. Fill about one quarter of your plate with lean proteins, such as fish, chicken without skin, beans, eggs, and tofu. ? Avoid premade and processed foods. These tend to be higher in sodium, added sugar, and fat.  Reduce your daily sodium intake. Most people with hypertension should eat less than 1,500 mg of sodium a day.  Limit alcohol intake to no more than 1 drink a day for nonpregnant women and 2 drinks a day for men. One drink equals 12 oz of beer, 5 oz of wine, or 1 oz of hard  liquor. Lifestyle  Work with your health care provider to maintain a healthy body weight or to lose weight. Ask what an ideal weight is for you.  Get at least 30 minutes of exercise that causes your heart to beat faster (aerobic exercise) most days of the week. Activities may include walking, swimming, or biking.  Include exercise to strengthen your muscles (resistance exercise), such as pilates or lifting weights, as part of your weekly exercise routine. Try to do these types of exercises for 30 minutes at least 3 days a week.  Do not use any products that contain nicotine or tobacco, such as cigarettes and e-cigarettes. If you need help quitting, ask your health care provider.  Monitor your blood pressure at home as told by your health care provider.  Keep all follow-up visits as told by your health care provider. This is important. Medicines  Take over-the-counter and prescription medicines only as told by your health care provider. Follow directions carefully. Blood pressure medicines must be taken as prescribed.  Do not skip doses of blood pressure medicine. Doing this puts you at risk for problems and   can make the medicine less effective.  Ask your health care provider about side effects or reactions to medicines that you should watch for. Contact a health care provider if:  You think you are having a reaction to a medicine you are taking.  You have headaches that keep coming back (recurring).  You feel dizzy.  You have swelling in your ankles.  You have trouble with your vision. Get help right away if:  You develop a severe headache or confusion.  You have unusual weakness or numbness.  You feel faint.  You have severe pain in your chest or abdomen.  You vomit repeatedly.  You have trouble breathing. Summary  Hypertension is when the force of blood pumping through your arteries is too strong. If this condition is not controlled, it may put you at risk for serious  complications.  Your personal target blood pressure may vary depending on your medical conditions, your age, and other factors. For most people, a normal blood pressure is less than 120/80.  Hypertension is treated with lifestyle changes, medicines, or a combination of both. Lifestyle changes include weight loss, eating a healthy, low-sodium diet, exercising more, and limiting alcohol. This information is not intended to replace advice given to you by your health care provider. Make sure you discuss any questions you have with your health care provider. Document Released: 06/30/2005 Document Revised: 05/28/2016 Document Reviewed: 05/28/2016 Elsevier Interactive Patient Education  2018 [employer] Inc[employer]  

## 2018-01-02 NOTE — Progress Notes (Signed)
No chief complaint on file.   HPI  4 review of systems  Past Medical History:  Diagnosis Date  . Allergic rhinitis    pollen  . Hypertension, essential   . Obesity     Current Outpatient Medications  Medication Sig Dispense Refill  . aspirin 81 MG tablet Take 1 tablet (81 mg total) by mouth daily. 90 tablet 3  . atorvastatin (LIPITOR) 20 MG tablet Take 1 tablet (20 mg total) by mouth daily. 90 tablet 3  . cetirizine (ZYRTEC) 10 MG tablet Take 1 tablet (10 mg total) by mouth daily. 30 tablet 11  . fluticasone (FLONASE) 50 MCG/ACT nasal spray Place 2 sprays into both nostrils daily. 16 g 6  . hydrochlorothiazide (HYDRODIURIL) 25 MG tablet Take 1 tablet (25 mg total) by mouth daily. 90 tablet 1   No current facility-administered medications for this visit.     Allergies:  Allergies  Allergen Reactions  . Amlodipine     Lower extremity edema  . Lisinopril     Cough, muscle cramps  . Cephalexin Rash    Taking cephalexin for 2 days and is broken out in a facial rash with swelling of cheeks and eyelids. Incidentally, was also taking some ibuprofen at the same time but she has tolerated that well in the past and it probably is not the allergen.    Past Surgical History:  Procedure Laterality Date  . ABDOMINOPLASTY  2003  . 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 saline infusion [place]     for postmenopausal bleeding/fibroids/thickened endometrium ([place])    Social History   Socioeconomic History  . Marital status: Married    Spouse name: Not on file  . Number of children: Not on file  . Years of education: Not on file  . Highest education level: Not on file  Occupational History  . Not on file  Social Needs  . Financial resource strain: Not on file  . Food insecurity:    Worry: Not on file    Inability: Not on file  . Transportation needs:    Medical: Not on file    Non-medical: Not on file  Tobacco Use  . Smoking  status: Never Smoker  . Smokeless tobacco: Never Used  Substance and Sexual Activity  . Alcohol use: No    Alcohol/week: 0.0 oz  . Drug use: No  . Sexual activity: Never  Lifestyle  . Physical activity:    Days per week: Not on file    Minutes per session: Not on file  . Stress: Not on file  Relationships  . Social connections:    Talks on phone: Not on file    Gets together: Not on file    Attends religious service: Not on file    Active member of club or organization: Not on file    Attends meetings of clubs or organizations: Not on file    Relationship status: Not on file  Other Topics Concern  . 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 Diabetes Mother   . CAD Mother 20       CABG  . Hypertension Mother   .  Kidney disease Mother        ESRD on HD  . Colon polyps Father   . Cancer Maternal Grandmother 38       GYN  . Ovarian cancer Maternal Grandmother   . Cancer Maternal Grandfather        HEENT  . Colon cancer Neg Hx   . Rectal cancer Neg Hx   . Stomach cancer Neg Hx      ROS Review of Systems See HPI Constitution: No fevers or chills No malaise No diaphoresis Skin: No rash or itching Eyes: no blurry vision, no double vision GU: no dysuria or hematuria Neuro: no dizziness or headaches * all others reviewed and negative   Objective: There were no vitals filed for this visit.  Physical Exam  Assessment and Plan There are no diagnoses linked to this encounter.   Priscilla Lane

## 2018-01-02 NOTE — Progress Notes (Signed)
Priscilla Lane 57 y.o.   Chief Complaint  Patient presents with  . Hypertension    follow up    HISTORY OF PRESENT ILLNESS: This is a 57 y.o. female with history of hypertension here for follow-up.  Needs refill on hydrochlorothiazide.  Doing well.  Has no complaints or medical concerns at present time.  HPI   Prior to Admission medications   Medication Sig Start Date End Date Taking? Authorizing Provider  aspirin 81 MG tablet Take 1 tablet (81 mg total) by mouth daily. 08/14/16  Yes Stallings, Zoe A, MD  atorvastatin (LIPITOR) 20 MG tablet Take 1 tablet (20 mg total) by mouth daily. 08/14/16  Yes Forrest Moron, MD  cetirizine (ZYRTEC) 10 MG tablet Take 1 tablet (10 mg total) by mouth daily. 12/10/16  Yes Stallings, Zoe A, MD  fluticasone (FLONASE) 50 MCG/ACT nasal spray Place 2 sprays into both nostrils daily. 12/10/16  Yes Stallings, Zoe A, MD  hydrochlorothiazide (HYDRODIURIL) 25 MG tablet Take 1 tablet (25 mg total) by mouth daily. 01/02/18  Yes Horald Pollen, MD    Allergies  Allergen Reactions  . Amlodipine     Lower extremity edema  . Lisinopril     Cough, muscle cramps  . Cephalexin Rash    Taking cephalexin for 2 days and is broken out in a facial rash with swelling of cheeks and eyelids. Incidentally, was also taking some ibuprofen at the same time but she has tolerated that well in the past and it probably is not the allergen.    Patient Active Problem List   Diagnosis Date Noted  . Post-menopausal bleeding 11/02/2014  . Health maintenance examination 04/14/2014  . Hand pain 01/12/2014  . Hypertension, essential   . Class 1 obesity due to excess calories with serious comorbidity and body mass index (BMI) of 33.0 to 33.9 in adult     Past Medical History:  Diagnosis Date  . Allergic rhinitis    pollen  . Hypertension, essential   . Obesity     Past Surgical History:  Procedure Laterality Date  . ABDOMINOPLASTY  2003  . 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 saline infusion [place]     for postmenopausal bleeding/fibroids/thickened endometrium ([place])    Social History   Socioeconomic History  . Marital status: Married    Spouse name: Not on file  . Number of children: Not on file  . Years of education: Not on file  . Highest education level: Not on file  Occupational History  . Not on file  Social Needs  . Financial resource strain: Not on file  . Food insecurity:    Worry: Not on file    Inability: Not on file  . Transportation needs:    Medical: Not on file    Non-medical: Not on file  Tobacco Use  . Smoking status: Never Smoker  . Smokeless tobacco: Never Used  Substance and Sexual Activity  . Alcohol use: No    Alcohol/week: 0.0 oz  . Drug use: No  . Sexual activity: Never  Lifestyle  . Physical activity:    Days per week: Not on file    Minutes per session: Not on file  . Stress: Not on file  Relationships  . Social connections:    Talks on phone: Not on file    Gets together: Not on file    Attends religious service: Not on file  Active member of club or organization: Not on file    Attends meetings of clubs or organizations: Not on file    Relationship status: Not on file  . Intimate partner violence:    Fear of current or ex partner: Not on file    Emotionally abused: Not on file    Physically abused: Not on file    Forced sexual activity: Not on file  Other Topics Concern  . 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 Diabetes Mother   . CAD Mother 41       CABG  . Hypertension Mother   . Kidney disease Mother        ESRD on HD  . Colon polyps Father   . Cancer Maternal Grandmother 25        GYN  . Ovarian cancer Maternal Grandmother   . Cancer Maternal Grandfather        HEENT  . Colon cancer Neg Hx   . Rectal cancer Neg Hx   . Stomach cancer Neg Hx      Review of Systems  Constitutional: Negative.  Negative for chills, fever and weight loss.  HENT: Negative.  Negative for sore throat.   Eyes: Negative.  Negative for blurred vision and double vision.  Respiratory: Negative.  Negative for cough, hemoptysis and shortness of breath.   Cardiovascular: Negative.  Negative for chest pain, palpitations and leg swelling.  Gastrointestinal: Negative.  Negative for abdominal pain, diarrhea, nausea and vomiting.  Genitourinary: Negative.  Negative for dysuria and hematuria.  Musculoskeletal: Negative for back pain, myalgias and neck pain.  Skin: Negative.  Negative for rash.  Neurological: Negative.  Negative for dizziness and headaches.  Endo/Heme/Allergies: Negative.   All other systems reviewed and are negative.   Vitals:   01/02/18 0824  BP: 132/80  Pulse: 75  Resp: 17  Temp: 98.4 F (36.9 C)  SpO2: 99%    Physical Exam  Constitutional: She is oriented to person, place, and time. She appears well-developed and well-nourished.  HENT:  Head: Normocephalic and atraumatic.  Nose: Nose normal.  Mouth/Throat: Oropharynx is clear and moist.  Eyes: Pupils are equal, round, and reactive to light. Conjunctivae are normal.  Neck: Normal range of motion. Neck supple.  Cardiovascular: Normal rate, regular rhythm and normal heart sounds.  Pulmonary/Chest: Effort normal and breath sounds normal.  Abdominal: Soft. Bowel sounds are normal. She exhibits no distension. There is no tenderness.  Musculoskeletal: Normal range of motion. She exhibits no edema or tenderness.  Lymphadenopathy:    She has no cervical adenopathy.  Neurological: She is alert and oriented to person, place, and time. No sensory deficit. She exhibits normal muscle tone.  Skin: Skin is warm and dry.    Psychiatric: She has a normal mood and affect. Her behavior is normal.  Vitals reviewed.    ASSESSMENT & PLAN: Priscilla Lane was seen today for hypertension.  Diagnoses and all orders for this visit:  Hypertension, essential -     hydrochlorothiazide (HYDRODIURIL) 25 MG tablet; Take 1 tablet (25 mg total) by mouth daily.  Breast cancer screening -     MM Digital Screening; Future    Patient Instructions  IF you received an x-ray today, you will receive an invoice from [facility]. Please contact [facility] at [phone] with questions or concerns regarding your invoice.   IF you received labwork today, you will receive an invoice from [facility]. Please contact [facility] at [phone] with questions or concerns regarding your invoice.   Our billing staff will not be able to assist you with questions regarding bills from these companies.  You will be contacted with the lab results as soon as they are available. The fastest way to get your results is to activate your My Chart account. Instructions are located on the last page of this paperwork. If you have not heard from [place] regarding the results in 2 weeks, please contact this office.     Hypertension Hypertension, commonly called high blood pressure, is when the force of blood pumping through the arteries is too strong. The arteries are the blood vessels that carry blood from the heart throughout the body. Hypertension forces the heart to work harder to pump blood and may cause arteries to become narrow or stiff. Having untreated or uncontrolled hypertension can cause heart attacks, strokes, kidney disease, and other problems. A blood pressure reading consists of a higher number over a lower number. Ideally, your blood pressure should be below 120/80. The first ("top") number is called the systolic pressure. It is a measure of the pressure in your arteries as your heart beats. The second ("bottom") number is  called the diastolic pressure. It is a measure of the pressure in your arteries as the heart relaxes. What are the causes? The cause of this condition is not known. What increases the risk? Some risk factors for high blood pressure are under your control. Others are not. Factors you can change  Smoking.  Having type 2 diabetes mellitus, high cholesterol, or both.  Not getting enough exercise or physical activity.  Being overweight.  Having too much fat, sugar, calories, or salt (sodium) in your diet.  Drinking too much alcohol. Factors that are difficult or impossible to change  Having chronic kidney disease.  Having a family history of high blood pressure.  Age. Risk increases with age.  Race. You may be at higher risk if you are African-American.  Gender. Men are at higher risk than women before age 58. After age 74, women are at higher risk than men.  Having obstructive sleep apnea.  Stress. What are the signs or symptoms? Extremely high blood pressure (hypertensive crisis) may cause:  Headache.  Anxiety.  Shortness of breath.  Nosebleed.  Nausea and vomiting.  Severe chest pain.  Jerky movements you cannot control (seizures).  How is this diagnosed? This condition is diagnosed by measuring your blood pressure while you are seated, with your arm resting on a surface. The cuff of the blood pressure monitor will be placed directly against the skin of your upper arm at the level of your heart. It should be measured at least twice using the same arm. Certain conditions can cause a difference in blood pressure between your right and left arms. Certain factors can cause blood pressure readings to be lower or higher than normal (elevated) for a short period of time:  When your blood pressure is higher when you are in a health care provider's office than when you are at home, this is called white coat hypertension. Most people with this condition do not need  medicines.  When your blood pressure is higher at home than when you are in  a health care provider's office, this is called masked hypertension. Most people with this condition may need medicines to control blood pressure.  If you have a high blood pressure reading during one visit or you have normal blood pressure with other risk factors:  You may be asked to return on a different day to have your blood pressure checked again.  You may be asked to monitor your blood pressure at home for 1 week or longer.  If you are diagnosed with hypertension, you may have other blood or imaging tests to help your health care provider understand your overall risk for other conditions. How is this treated? This condition is treated by making healthy lifestyle changes, such as eating healthy foods, exercising more, and reducing your alcohol intake. Your health care provider may prescribe medicine if lifestyle changes are not enough to get your blood pressure under control, and if:  Your systolic blood pressure is above 130.  Your diastolic blood pressure is above 80.  Your personal target blood pressure may vary depending on your medical conditions, your age, and other factors. Follow these instructions at home: Eating and drinking  Eat a diet that is high in fiber and potassium, and low in sodium, added sugar, and fat. An example eating plan is called the DASH (Dietary Approaches to Stop Hypertension) diet. To eat this way: ? Eat plenty of fresh fruits and vegetables. Try to fill half of your plate at each meal with fruits and vegetables. ? Eat whole grains, such as whole wheat pasta, brown rice, or whole grain bread. Fill about one quarter of your plate with whole grains. ? Eat or drink low-fat dairy products, such as skim milk or low-fat yogurt. ? Avoid fatty cuts of meat, processed or cured meats, and poultry with skin. Fill about one quarter of your plate with lean proteins, such as fish, chicken  without skin, beans, eggs, and tofu. ? Avoid premade and processed foods. These tend to be higher in sodium, added sugar, and fat.  Reduce your daily sodium intake. Most people with hypertension should eat less than 1,500 mg of sodium a day.  Limit alcohol intake to no more than 1 drink a day for nonpregnant women and 2 drinks a day for men. One drink equals 12 oz of beer, 5 oz of wine, or 1 oz of hard liquor. Lifestyle  Work with your health care provider to maintain a healthy body weight or to lose weight. Ask what an ideal weight is for you.  Get at least 30 minutes of exercise that causes your heart to beat faster (aerobic exercise) most days of the week. Activities may include walking, swimming, or biking.  Include exercise to strengthen your muscles (resistance exercise), such as pilates or lifting weights, as part of your weekly exercise routine. Try to do these types of exercises for 30 minutes at least 3 days a week.  Do not use any products that contain nicotine or tobacco, such as cigarettes and e-cigarettes. If you need help quitting, ask your health care provider.  Monitor your blood pressure at home as told by your health care provider.  Keep all follow-up visits as told by your health care provider. This is important. Medicines  Take over-the-counter and prescription medicines only as told by your health care provider. Follow directions carefully. Blood pressure medicines must be taken as prescribed.  Do not skip doses of blood pressure medicine. Doing this puts you at risk for problems and can make the  medicine less effective.  Ask your health care provider about side effects or reactions to medicines that you should watch for. Contact a health care provider if:  You think you are having a reaction to a medicine you are taking.  You have headaches that keep coming back (recurring).  You feel dizzy.  You have swelling in your ankles.  You have trouble with your  vision. Get help right away if:  You develop a severe headache or confusion.  You have unusual weakness or numbness.  You feel faint.  You have severe pain in your chest or abdomen.  You vomit repeatedly.  You have trouble breathing. Summary  Hypertension is when the force of blood pumping through your arteries is too strong. If this condition is not controlled, it may put you at risk for serious complications.  Your personal target blood pressure may vary depending on your medical conditions, your age, and other factors. For most people, a normal blood pressure is less than 120/80.  Hypertension is treated with lifestyle changes, medicines, or a combination of both. Lifestyle changes include weight loss, eating a healthy, low-sodium diet, exercising more, and limiting alcohol. This information is not intended to replace advice given to you by your health care provider. Make sure you discuss any questions you have with your health care provider. Document Released: 06/30/2005 Document Revised: 05/28/2016 Document Reviewed: 05/28/2016 Elsevier Interactive Patient Education  2018 Elsevier Inc.      Agustina Caroli, MD [facility]

## 2018-01-07 ENCOUNTER — Other Ambulatory Visit: Payer: Self-pay | Admitting: Family Medicine

## 2018-06-12 ENCOUNTER — Ambulatory Visit (HOSPITAL_COMMUNITY)
Admission: EM | Admit: 2018-06-12 | Discharge: 2018-06-12 | Disposition: A | Discharge status: Home / Self Care | Payer: BLUE CROSS/BLUE SHIELD | Attending: Internal Medicine | Admitting: Internal Medicine

## 2018-06-12 ENCOUNTER — Other Ambulatory Visit: Payer: Self-pay

## 2018-06-12 ENCOUNTER — Encounter (HOSPITAL_COMMUNITY): Payer: Self-pay | Admitting: *Deleted

## 2018-06-12 DIAGNOSIS — R062 Wheezing: Secondary | ICD-10-CM

## 2018-06-12 DIAGNOSIS — J069 Acute upper respiratory infection, unspecified: Secondary | ICD-10-CM

## 2018-06-12 MED ORDER — ALBUTEROL SULFATE HFA 108 (90 BASE) MCG/ACT IN AERS: 2.0000 | INHALATION_SPRAY | Freq: Four times a day (QID) | RESPIRATORY_TRACT | 0 refills | Status: DC | PRN

## 2018-06-12 NOTE — Discharge Instructions (Addendum)
I believe you have viral bronchitis which resolves on its own in 7-14 days. If you get worse after 10 days, could be it turned into a secondary infection. You need to be seen again.   Monitor your temperature and if you get a fever of 100.5 or more, you need to be seen again. Get Zinc lozenges to help boost your immune system OK to continue the Honey and ginger.

## 2018-06-12 NOTE — ED Provider Notes (Signed)
[facility]    CSN: [id number] Arrival date & time: 06/12/18  1129     History   Chief Complaint Chief Complaint  Patient presents with  . Nasal Congestion  . Cough    HPI Priscilla Lane is a 57 y.o. female.   Who developed scratchy ST and gradually got worse and developed a cough. Her cough is non productive.  Has not had a fever or rhinitis or post nasal drainage. Has heard wheezing last night. But ger graddaughter has the same thing. Has had bronchitis in the past, but is not prone to frequent infections. She started the sickness before granddaughter.  She was around her daughter who had a cold last week.  Denies body aches or HA.      Past Medical History:  Diagnosis Date  . Allergic rhinitis    pollen  . Hypertension, essential   . Obesity     Patient Active Problem List   Diagnosis Date Noted  . Post-menopausal bleeding 11/02/2014  . Health maintenance examination 04/14/2014  . Hand pain 01/12/2014  . Hypertension, essential   . Class 1 obesity due to excess calories with serious comorbidity and body mass index (BMI) of 33.0 to 33.9 in adult     Past Surgical History:  Procedure Laterality Date  . ABDOMINOPLASTY  2003  . 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 saline infusion [place]     for postmenopausal bleeding/fibroids/thickened endometrium ([place])    OB History   None      Home Medications    Prior to Admission medications   Medication Sig Start Date End Date Taking? Authorizing Provider  aspirin 81 MG tablet Take 1 tablet (81 mg total) by mouth daily. 08/14/16  Yes Forrest Moron, MD  cetirizine (ZYRTEC) 10 MG tablet Take 1 tablet (10 mg total) by mouth daily. 12/10/16  Yes Stallings, Zoe A, MD  fluticasone (FLONASE) 50 MCG/ACT nasal spray Place 2 sprays into both nostrils daily. 12/10/16  Yes Stallings, Zoe A, MD  hydrochlorothiazide (HYDRODIURIL) 25 MG tablet Take 1 tablet (25 mg  total) by mouth daily. 01/02/18  Yes Sagardia, Ines Bloomer, MD  albuterol (PROVENTIL HFA;VENTOLIN HFA) 108 (90 Base) MCG/ACT inhaler Inhale 2 puffs into the lungs every 6 (six) hours as needed for wheezing or shortness of breath. 06/12/18   Rodriguez-Southworth, Sunday Spillers, PA-C    Family History Family History  Problem Relation Age of Onset  . Diabetes Mother   . CAD Mother 15       CABG  . Hypertension Mother   . Kidney disease Mother        ESRD on HD  . Colon polyps Father   . Cancer Maternal Grandmother 81       GYN  . Ovarian cancer Maternal Grandmother   . Cancer Maternal Grandfather        HEENT  . Colon cancer Neg Hx   . Rectal cancer Neg Hx   . Stomach cancer Neg Hx     Social History Social History   Tobacco Use  . Smoking status: Never Smoker  . Smokeless tobacco: Never Used  Substance Use Topics  . Alcohol use: No    Alcohol/week: 0.0 standard drinks  . Drug use: No     Allergies   Amlodipine; Lisinopril; and Cephalexin   Review of Systems Review of Systems  Constitutional: Positive for chills and diaphoresis. Negative for appetite change, fatigue and fever.  Had night sweats last night  HENT: Positive for sneezing and voice change. Negative for congestion, ear pain, facial swelling, postnasal drip, rhinorrhea, sinus pain and sore throat.        Had mild sneezing yesterday  Eyes: Negative for discharge.  Respiratory: Positive for cough and wheezing.   Gastrointestinal: Negative for diarrhea, nausea and vomiting.  Skin: Negative for rash.  Neurological: Negative for headaches.     Physical Exam Triage Vital Signs ED Triage Vitals  Enc Vitals Group     BP 06/12/18 1239 134/79     Pulse Rate 06/12/18 1239 85     Resp 06/12/18 1239 20     Temp 06/12/18 1239 98.2 F (36.8 C)     Temp Source 06/12/18 1239 Oral     SpO2 06/12/18 1239 99 %     Weight --      Height --      Head Circumference --      Peak Flow --      Pain Score 06/12/18 1240  0     Pain Loc --      Pain Edu? --      Excl. in [place]? --    No data found.  Updated Vital Signs BP 134/79   Pulse 85   Temp 98.2 F (36.8 C) (Oral)   Resp 20   SpO2 99%   Visual Acuity Right Eye Distance:   Left Eye Distance:   Bilateral Distance:    Right Eye Near:   Left Eye Near:    Bilateral Near:     Physical Exam  Constitutional: She is oriented to person, place, and time. She appears well-developed. No distress.  HENT:  Head: Normocephalic.  Right Ear: External ear normal.  Left Ear: External ear normal.  Nose: Nose normal.  Mouth/Throat: Oropharynx is clear and moist. No oropharyngeal exudate.  Eyes: Conjunctivae are normal. Right eye exhibits no discharge. Left eye exhibits no discharge. No scleral icterus.  Neck: Neck supple. No tracheal deviation present.  Cardiovascular: Normal rate and regular rhythm.  No murmur heard. Pulmonary/Chest: Effort normal and breath sounds normal. No respiratory distress. She has no wheezes. She has no rales. She exhibits no tenderness.  I examined her laying down as well and lungs were clear  Musculoskeletal: Normal range of motion.  Lymphadenopathy:    She has no cervical adenopathy.  Neurological: She is alert and oriented to person, place, and time.  Skin: Skin is warm and dry. No rash noted. She is not diaphoretic.  Psychiatric: She has a normal mood and affect. Her behavior is normal. Judgment and thought content normal.  Nursing note and vitals reviewed.    UC Treatments / Results  Labs (all labs ordered are listed, but only abnormal results are displayed) Labs Reviewed - No data to display  EKG None  Radiology No results found.  Procedures   Medications Ordered in UC Medications - No data to display  Initial Impression / Assessment and Plan / UC Course  I have reviewed the triage vital signs and the nursing notes. I explained to her I believe she has viral bronchitis. See her instructions below.    Final Clinical Impressions(s) / UC Diagnoses   Final diagnoses:  Viral upper respiratory tract infection  Wheezing     Discharge Instructions     I believe you have viral bronchitis which resolves on its own in 7-14 days. If you get worse after 10 days, could be it turned into a  secondary infection. You need to be seen again.   Monitor your temperature and if you get a fever of 100.5 or more, you need to be seen again. Get Zinc lozenges to help boost your immune system OK to continue the Honey and ginger.     ED Prescriptions    Medication Sig Dispense Auth. Provider   albuterol (PROVENTIL HFA;VENTOLIN HFA) 108 (90 Base) MCG/ACT inhaler Inhale 2 puffs into the lungs every 6 (six) hours as needed for wheezing or shortness of breath. 1 Inhaler Rodriguez-Southworth, Sunday Spillers, PA-C     Controlled Substance Prescriptions [place] Controlled Substance Registry consulted? no   Shelby Mattocks, [place] 06/12/18 1343

## 2018-06-12 NOTE — ED Triage Notes (Signed)
Started 3 days ago with congestion, dry cough, wheezing.  Has been taking OTC meds without relief.

## 2018-07-01 ENCOUNTER — Other Ambulatory Visit: Payer: Self-pay

## 2018-07-01 ENCOUNTER — Ambulatory Visit: Payer: Self-pay | Admitting: Family Medicine

## 2018-07-01 ENCOUNTER — Encounter: Payer: Self-pay | Admitting: Family Medicine

## 2018-07-01 VITALS — BP 136/84 | HR 92 | Temp 98.7°F | Resp 16 | Ht 62.0 in | Wt 183.8 lb

## 2018-07-01 DIAGNOSIS — R109 Unspecified abdominal pain: Secondary | ICD-10-CM

## 2018-07-01 DIAGNOSIS — I10 Essential (primary) hypertension: Secondary | ICD-10-CM

## 2018-07-01 LAB — POCT URINALYSIS DIP (MANUAL ENTRY)
BILIRUBIN UA: NEGATIVE
Glucose, UA: NEGATIVE mg/dL
Ketones, POC UA: NEGATIVE mg/dL
Leukocytes, UA: NEGATIVE
NITRITE UA: NEGATIVE
PH UA: 5.5 (ref 5.0–8.0)
Protein Ur, POC: NEGATIVE mg/dL
RBC UA: NEGATIVE
UROBILINOGEN UA: 0.2 U/dL

## 2018-07-01 LAB — CMP14+EGFR
A/G RATIO: 1.7 (ref 1.2–2.2)
ALK PHOS: 58 IU/L (ref 39–117)
ALT: 23 IU/L (ref 0–32)
AST: 20 IU/L (ref 0–40)
Albumin: 4.6 g/dL (ref 3.5–5.5)
BUN / CREAT RATIO: 25 — AB (ref 9–23)
BUN: 22 mg/dL (ref 6–24)
CHLORIDE: 102 mmol/L (ref 96–106)
CO2: 24 mmol/L (ref 20–29)
CREATININE: 0.89 mg/dL (ref 0.57–1.00)
Calcium: 9.8 mg/dL (ref 8.7–10.2)
GFR calc Af Amer: 83 mL/min/{1.73_m2} (ref >59)
GFR calc non Af Amer: 72 mL/min/{1.73_m2} (ref >59)
GLOBULIN, TOTAL: 2.7 g/dL (ref 1.5–4.5)
Glucose: 90 mg/dL (ref 65–99)
POTASSIUM: 4.3 mmol/L (ref 3.5–5.2)
SODIUM: 140 mmol/L (ref 134–144)
Total Protein: 7.3 g/dL (ref 6.0–8.5)

## 2018-07-01 LAB — LIPID PANEL
Chol/HDL Ratio: 4.6 ratio — ABNORMAL HIGH (ref 0.0–4.4)
Cholesterol, Total: 255 mg/dL — ABNORMAL HIGH (ref 100–199)
HDL: 55 mg/dL (ref >39)
LDL CALC: 178 mg/dL — AB (ref 0–99)
TRIGLYCERIDES: 111 mg/dL (ref 0–149)
VLDL CHOLESTEROL CAL: 22 mg/dL (ref 5–40)

## 2018-07-01 MED ORDER — HYDROCHLOROTHIAZIDE 25 MG PO TABS: 25.0000 mg | ORAL_TABLET | Freq: Every day | ORAL | 3 refills | Status: DC

## 2018-07-01 NOTE — Progress Notes (Signed)
Established Patient Office Visit  Subjective:  Patient ID: Priscilla Lane, female    DOB: Dec 07, 1960  Age: 57 y.o. MRN: [id number]  CC:  Chief Complaint  Patient presents with  . Blood Pressure Check  . Medication Refill    hctz  . URI    [facility] told she had bronchitis(seen during thanksgiving holiday) and given inhaler but pt c/o wheezing/tightness and cough.  Coughing up yellow drainage    HPI Priscilla Lane presents for   Pt here for hypertension follow up  BP Readings from Last 3 Encounters:  07/01/18 136/84  06/12/18 134/79  01/02/18 132/80    She is taking hctz and needs a refill She is sticking to a DASH diet  Wt Readings from Last 3 Encounters:  07/01/18 183 lb 12.8 oz (83.4 kg)  01/02/18 186 lb (84.4 kg)  06/08/17 182 lb 12.8 oz (82.9 kg)   Acute URI She recently had a viral syndrome and was seen at [facility] She was given albuterol inhaler She has poor appetite but she is eating to keep her strength up.  NO FEVERS OR CHILLS No nausea or vomiting  Side pain She also has some side pain when she turns a certain way nonradiating No acute injury She has not been drinking much water She has been less active and has been laying around more   Past Medical History:  Diagnosis Date  . Allergic rhinitis    pollen  . Hypertension, essential   . Obesity     Past Surgical History:  Procedure Laterality Date  . ABDOMINOPLASTY  2003  . 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 saline infusion [place]     for postmenopausal bleeding/fibroids/thickened endometrium ([place])    Family History  Problem Relation Age of Onset  . Diabetes Mother   . CAD Mother 29       CABG  . Hypertension Mother   . Kidney disease Mother        ESRD on HD  . Colon polyps Father   . Cancer Maternal Grandmother 64       GYN  . Ovarian cancer Maternal Grandmother   . Cancer Maternal Grandfather        HEENT  . Colon  cancer Neg Hx   . Rectal cancer Neg Hx   . Stomach cancer Neg Hx     Social History   Socioeconomic History  . Marital status: Married    Spouse name: Not on file  . Number of children: Not on file  . Years of education: Not on file  . Highest education level: Not on file  Occupational History  . Not on file  Social Needs  . Financial resource strain: Not on file  . Food insecurity:    Worry: Not on file    Inability: Not on file  . Transportation needs:    Medical: Not on file    Non-medical: Not on file  Tobacco Use  . Smoking status: Never Smoker  . Smokeless tobacco: Never Used  Substance and Sexual Activity  . Alcohol use: No    Alcohol/week: 0.0 standard drinks  . Drug use: No  . Sexual activity: Never  Lifestyle  . Physical activity:    Days per week: Not on file    Minutes per session: Not on file  . Stress: Not on file  Relationships  . Social connections:    Talks on  phone: Not on file    Gets together: Not on file    Attends religious service: Not on file    Active member of club or organization: Not on file    Attends meetings of clubs or organizations: Not on file    Relationship status: Not on file  . Intimate partner violence:    Fear of current or ex partner: Not on file    Emotionally abused: Not on file    Physically abused: Not on file    Forced sexual activity: Not on file  Other Topics Concern  . 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 albuterol (PROVENTIL HFA;VENTOLIN HFA) 108 (90 Base) MCG/ACT inhaler Inhale 2 puffs into the lungs every 6 (six) hours as needed for wheezing or shortness of breath. 1 Inhaler 0  . aspirin 81 MG tablet Take 1 tablet (81 mg total) by  mouth daily. 90 tablet 3  . cetirizine (ZYRTEC) 10 MG tablet Take 1 tablet (10 mg total) by mouth daily. 30 tablet 11  . fluticasone (FLONASE) 50 MCG/ACT nasal spray Place 2 sprays into both nostrils daily. 16 g 6  . hydrochlorothiazide (HYDRODIURIL) 25 MG tablet Take 1 tablet (25 mg total) by mouth daily. 90 tablet 3   No facility-administered medications prior to visit.     Allergies  Allergen Reactions  . Amlodipine     Lower extremity edema  . Lisinopril     Cough, muscle cramps  . Cephalexin Rash    Taking cephalexin for 2 days and is broken out in a facial rash with swelling of cheeks and eyelids. Incidentally, was also taking some ibuprofen at the same time but she has tolerated that well in the past and it probably is not the allergen.    ROS Review of Systems    Objective:    Physical Exam  BP 136/84 (BP Location: Right Arm, Patient Position: Sitting, Cuff Size: Large)   Pulse 92   Temp 98.7 F (37.1 C) (Oral)   Resp 16   Ht '5\' 2"'$  (1.575 m)   Wt 183 lb 12.8 oz (83.4 kg)   SpO2 97%   BMI 33.62 kg/m  Wt Readings from Last 3 Encounters:  07/01/18 183 lb 12.8 oz (83.4 kg)  01/02/18 186 lb (84.4 kg)  06/08/17 182 lb 12.8 oz (82.9 kg)   General: alert, oriented, in NAD Head: normocephalic, atraumatic, no sinus tenderness Eyes: EOM intact, no scleral icterus or conjunctival injection Ears: TM clear bilaterally Nose: mucosa nonerythematous, nonedematous Throat: no pharyngeal exudate or erythema Lymph: no posterior auricular, submental or cervical lymph adenopathy Heart: normal rate, normal sinus rhythm, no murmurs Lungs: clear to auscultation bilaterally, no wheezing Abdomen: flank tenderness on left, no rebound, no guarding, no suprapubic pain   Health Maintenance Due  Topic Date Due  . Hepatitis C Screening  05-Oct-1960  . HIV Screening  11/02/1974  . MAMMOGRAM  06/05/2016  . PAP SMEAR-Modifier  04/14/2017  . INFLUENZA VACCINE  02/11/2018    There are  no preventive care reminders to display for this patient.  Lab Results  Component Value Date   TSH 3.50 04/10/2014   Lab Results  Component Value Date  WBC 4.8 12/10/2016   HGB 11.5 12/10/2016   HCT 34.8 12/10/2016   MCV 91 12/10/2016   PLT 227 12/10/2016   Lab Results  Component Value Date   NA 142 03/11/2017   K 4.2 03/11/2017   CO2 21 03/11/2017   GLUCOSE 91 03/11/2017   BUN 16 03/11/2017   CREATININE 0.73 03/11/2017   BILITOT 0.6 04/10/2014   ALKPHOS 37 (L) 04/10/2014   AST 30 04/10/2014   ALT 25 04/10/2014   PROT 7.2 04/10/2014   ALBUMIN 4.3 04/10/2014   CALCIUM 9.8 03/11/2017   GFR 117.77 04/10/2014   Lab Results  Component Value Date   CHOL 228 (H) 08/13/2016   Lab Results  Component Value Date   HDL 57 08/13/2016   Lab Results  Component Value Date   LDLCALC 143 (H) 08/13/2016   Lab Results  Component Value Date   TRIG 139 08/13/2016   Lab Results  Component Value Date   CHOLHDL 4.0 08/13/2016   Lab Results  Component Value Date   HGBA1C 5.6 03/06/2015      Assessment & Plan:   Problem List Items Addressed This Visit      Cardiovascular and Mediastinum   Hypertension, essential  -  Patient's blood pressure is at goal of 139/89 or less. Condition is stable. Continue current medications and treatment plan. I recommend that you exercise for 30-45 minutes 5 days a week. I also recommend a balanced diet with fruits and vegetables every day, lean meats, and little fried foods. The DASH diet (you can find this online) is a good example of this.    Relevant Orders   Lipid panel   CMP14+EGFR    Other Visit Diagnoses    Left flank pain    -  Primary  No UTI noted, likely muscle strain, advised aspercreme    Relevant Orders   POCT urinalysis dipstick     Acute URI - Discussed viral etiology Discussed otc decongestant Advised flonase for congestion  Increase hydration Vitamin C and zinc lozenges suggested Return to clinic if symptoms  worse and if symptoms do not resolve in by end of December will get xray of the chest or referral to ENT    No orders of the defined types were placed in this encounter.   Follow-up: No follow-ups on file.    Forrest Moron, MD

## 2018-07-01 NOTE — Patient Instructions (Signed)
° ° ° °  If you have lab work done today you will be contacted with your lab results within the next 2 weeks.  If you have not heard from us then please contact us. The fastest way to get your results is to register for My Chart. ° ° °IF you received an x-ray today, you will receive an invoice from [facility]. Please contact [facility] at [phone] with questions or concerns regarding your invoice.  ° °IF you received labwork today, you will receive an invoice from [facility]. Please contact [facility] at [phone] with questions or concerns regarding your invoice.  ° °Our billing staff will not be able to assist you with questions regarding bills from these companies. ° °You will be contacted with the lab results as soon as they are available. The fastest way to get your results is to activate your My Chart account. Instructions are located on the last page of this paperwork. If you have not heard from us regarding the results in 2 weeks, please contact this office. °  ° ° ° °

## 2018-07-03 ENCOUNTER — Ambulatory Visit: Payer: Self-pay | Admitting: Physician Assistant

## 2019-01-03 ENCOUNTER — Encounter: Payer: Self-pay | Admitting: Family Medicine

## 2019-01-04 ENCOUNTER — Other Ambulatory Visit: Payer: Self-pay

## 2019-01-04 ENCOUNTER — Encounter: Payer: Self-pay | Admitting: Family Medicine

## 2019-01-04 ENCOUNTER — Other Ambulatory Visit (HOSPITAL_COMMUNITY)
Admission: RE | Admit: 2019-01-04 | Discharge: 2019-01-04 | Disposition: A | Discharge status: Home / Self Care | Payer: Self-pay | Source: Ambulatory Visit | Attending: Family Medicine | Admitting: Family Medicine

## 2019-01-04 ENCOUNTER — Ambulatory Visit: Payer: Self-pay | Admitting: Family Medicine

## 2019-01-04 VITALS — BP 130/80 | HR 88 | Temp 97.9°F | Resp 17 | Ht 62.0 in | Wt 183.6 lb

## 2019-01-04 DIAGNOSIS — Z1239 Encounter for other screening for malignant neoplasm of breast: Secondary | ICD-10-CM

## 2019-01-04 DIAGNOSIS — Z114 Encounter for screening for human immunodeficiency virus [HIV]: Secondary | ICD-10-CM

## 2019-01-04 DIAGNOSIS — Z0000 Encounter for general adult medical examination without abnormal findings: Secondary | ICD-10-CM

## 2019-01-04 DIAGNOSIS — Z1159 Encounter for screening for other viral diseases: Secondary | ICD-10-CM

## 2019-01-04 DIAGNOSIS — Z124 Encounter for screening for malignant neoplasm of cervix: Secondary | ICD-10-CM | POA: Insufficient documentation

## 2019-01-04 DIAGNOSIS — E785 Hyperlipidemia, unspecified: Secondary | ICD-10-CM

## 2019-01-04 DIAGNOSIS — Z0001 Encounter for general adult medical examination with abnormal findings: Secondary | ICD-10-CM

## 2019-01-04 LAB — LIPID PANEL

## 2019-01-04 NOTE — Patient Instructions (Addendum)
  We recommend that you schedule a mammogram for breast cancer screening. Typically, you do not need a referral to do this. Please contact a local imaging center to schedule your mammogram.  [facility] ([facility]) - [phone] or [phone]     If you have lab work done today you will be contacted with your lab results within the next 2 weeks.  If you have not heard from us then please contact us. The fastest way to get your results is to register for My Chart.   IF you received an x-ray today, you will receive an invoice from [facility]. Please contact [facility] at [phone] with questions or concerns regarding your invoice.   IF you received labwork today, you will receive an invoice from [facility]. Please contact [facility] at [phone] with questions or concerns regarding your invoice.   Our billing staff will not be able to assist you with questions regarding bills from these companies.  You will be contacted with the lab results as soon as they are available. The fastest way to get your results is to activate your My Chart account. Instructions are located on the last page of this paperwork. If you have not heard from us regarding the results in 2 weeks, please contact this office.     

## 2019-01-04 NOTE — Progress Notes (Signed)
Chief Complaint  Patient presents with  . Annual Exam    cpe with pap    Subjective:  Priscilla Lane is a 58 y.o. female here for a health maintenance visit.  Patient is established pt  Patient Active Problem List   Diagnosis Date Noted  . Post-menopausal bleeding 11/02/2014  . Health maintenance examination 04/14/2014  . Hand pain 01/12/2014  . Hypertension, essential   . Class 1 obesity due to excess calories with serious comorbidity and body mass index (BMI) of 33.0 to 33.9 in adult     Past Medical History:  Diagnosis Date  . Allergic rhinitis    pollen  . Hypertension, essential   . Obesity     Past Surgical History:  Procedure Laterality Date  . ABDOMINOPLASTY  2003  . 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 saline infusion [place]     for postmenopausal bleeding/fibroids/thickened endometrium ([place])     Outpatient Medications Prior to Visit  Medication Sig Dispense Refill  . aspirin 81 MG tablet Take 1 tablet (81 mg total) by mouth daily. 90 tablet 3  . hydrochlorothiazide (HYDRODIURIL) 25 MG tablet Take 1 tablet (25 mg total) by mouth daily. 90 tablet 3  . albuterol (PROVENTIL HFA;VENTOLIN HFA) 108 (90 Base) MCG/ACT inhaler Inhale 2 puffs into the lungs every 6 (six) hours as needed for wheezing or shortness of breath. (Patient not taking: Reported on 01/04/2019) 1 Inhaler 0  . cetirizine (ZYRTEC) 10 MG tablet Take 1 tablet (10 mg total) by mouth daily. (Patient not taking: Reported on 01/04/2019) 30 tablet 11  . fluticasone (FLONASE) 50 MCG/ACT nasal spray Place 2 sprays into both nostrils daily. (Patient not taking: Reported on 01/04/2019) 16 g 6   No facility-administered medications prior to visit.     Allergies  Allergen Reactions  . Amlodipine     Lower extremity edema  . Lisinopril     Cough, muscle cramps  . Cephalexin Rash    Taking cephalexin for 2 days and is broken out in a facial rash with swelling  of cheeks and eyelids. Incidentally, was also taking some ibuprofen at the same time but she has tolerated that well in the past and it probably is not the allergen.     Family History  Problem Relation Age of Onset  . Diabetes Mother   . CAD Mother 40       CABG  . Hypertension Mother   . Kidney disease Mother        ESRD on HD  . Colon polyps Father   . Cancer Maternal Grandmother 33       GYN  . Ovarian cancer Maternal Grandmother   . Cancer Maternal Grandfather        HEENT  . Colon cancer Neg Hx   . Rectal cancer Neg Hx   . Stomach cancer Neg Hx      Health Habits: Dental Exam: up to date Eye Exam: up to date Exercise: 3 times/week on average Current exercise activities: walking/running Diet: balanced  Wt Readings from Last 3 Encounters:  01/04/19 183 lb 9.6 oz (83.3 kg)  07/01/18 183 lb 12.8 oz (83.4 kg)  01/02/18 186 lb (84.4 kg)     Social History   Socioeconomic History  . Marital status: Married    Spouse name: Not on file  . Number of children: Not on file  . Years of education: Not on file  . Highest  education level: Not on file  Occupational History  . Not on file  Social Needs  . Financial resource strain: Not on file  . Food insecurity    Worry: Not on file    Inability: Not on file  . Transportation needs    Medical: Not on file    Non-medical: Not on file  Tobacco Use  . Smoking status: Never Smoker  . Smokeless tobacco: Never Used  Substance and Sexual Activity  . Alcohol use: No    Alcohol/week: 0.0 standard drinks  . Drug use: No  . Sexual activity: Never  Lifestyle  . Physical activity    Days per week: Not on file    Minutes per session: Not on file  . Stress: Not on file  Relationships  . Social Herbalist on phone: Not on file    Gets together: Not on file    Attends religious service: Not on file    Active member of club or organization: Not on file    Attends meetings of clubs or organizations: Not on file     Relationship status: Not on file  . Intimate partner violence    Fear of current or ex partner: Not on file    Emotionally abused: Not on file    Physically abused: Not on file    Forced sexual activity: Not on file  Other Topics Concern  . 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 Alcohol/week: 0.0 standard drinks   Social History   Tobacco Use  Smoking Status Never Smoker  Smokeless Tobacco Never Used   Social History   Substance and Sexual Activity  Drug Use No    GYN: Sexual Health Menstrual status: regular menses LMP: No LMP recorded. Patient is postmenopausal. Last pap smear: see HM section History of abnormal pap smears:  Sexually active: with female partner Current contraception: menopause  Health Maintenance: See under health Maintenance activity for review of completion dates as well. Immunization History  Administered Date(s) Administered  . Tdap 04/14/2014, 10/28/2015      Depression Screen-PHQ2/9 Depression screen [facility] 2/9 01/04/2019 07/01/2018 01/02/2018 03/11/2017 12/10/2016  Decreased Interest 0 0 0 0 0  Down, Depressed, Hopeless 0 0 0 0 0  PHQ - 2 Score 0 0 0 0 0       Depression Severity and Treatment Recommendations:  0-4= None  5-9= Mild / Treatment: Support, educate to call if worse; return in one month  10-14= Moderate / Treatment: Support, watchful waiting; Antidepressant or Psycotherapy  15-19= Moderately severe / Treatment: Antidepressant OR Psychotherapy  >= 20 = Major depression, severe / Antidepressant AND Psychotherapy    Review of Systems   ROS  See HPI for ROS as well.   Review of Systems  Constitutional: Negative for activity change, appetite change,  chills and fever.  HENT: Negative for congestion, nosebleeds, trouble swallowing and voice change.   Respiratory: Negative for cough, shortness of breath and wheezing.   Gastrointestinal: Negative for diarrhea, nausea and vomiting.  Genitourinary: Negative for difficulty urinating, dysuria, flank pain and hematuria.  Musculoskeletal: Negative for back pain, joint swelling and neck pain.  Neurological: Negative for dizziness, speech difficulty,  light-headedness and numbness.  See HPI. All other review of systems negative.    Objective:   Vitals:   01/04/19 0809 01/04/19 0843  BP: (!) 163/81 130/80  Pulse: 88   Resp: 17   Temp: 97.9 F (36.6 C)   TempSrc: Oral   SpO2: 98%   Weight: 183 lb 9.6 oz (83.3 kg)   Height: 5' 2" (1.575 m)     Body mass index is 33.58 kg/m.  Physical Exam  BP 130/80 (BP Location: Left Arm, Patient Position: Sitting, Cuff Size: Normal)   Pulse 88   Temp 97.9 F (36.6 C) (Oral)   Resp 17   Ht 5' 2" (1.575 m)   Wt 183 lb 9.6 oz (83.3 kg)   SpO2 98%   BMI 33.58 kg/m   Chaperone present General Appearance:    Alert, cooperative, no distress, appears stated age  Head:    Normocephalic, without obvious abnormality, atraumatic  Eyes:    PERRL, conjunctiva/corneas clear, EOM's intact, fundi    benign, both eyes  Ears:    Normal TM's and external ear canals, both ears  Nose:   Nares normal, septum midline, mucosa normal, no drainage    or sinus tenderness  Throat:   Lips, mucosa, and tongue normal; teeth and gums normal  Neck:   Supple, symmetrical, trachea midline, no adenopathy;    thyroid:  no enlargement/tenderness/nodules; no carotid   bruit or JVD  Back:     Symmetric, no curvature, ROM normal, no CVA tenderness  Lungs:     Clear to auscultation bilaterally, respirations unlabored  Chest Wall:    No tenderness or deformity   Heart:    Regular rate and rhythm, S1 and S2 normal, no murmur, rub   or gallop  Breast Exam:    No tenderness,  masses, or nipple abnormality  Abdomen:     Soft, non-tender, bowel sounds active all four quadrants,    no masses, no organomegaly  Genitalia:    Normal female without lesion, discharge or tenderness, no adnexal masses, no uterine masses, pap smear performed   Extremities:   Extremities normal, atraumatic, no cyanosis or edema  Pulses:   2+ and symmetric all extremities  Skin:   Skin color, texture, turgor normal, no rashes or lesions, skin tag left axillae, right gluteal skin tag  Lymph nodes:   Cervical, supraclavicular, and axillary nodes normal  Neurologic:   CNII-XII intact, normal strength, sensation and reflexes    throughout      Assessment/Plan:   Patient was seen for a health maintenance exam.  Counseled the patient on health maintenance issues. Reviewed her health mainteance schedule and ordered appropriate tests (see orders.) Counseled on regular exercise and weight management. Recommend regular eye exams and dental cleaning.   The following issues were addressed today for health maintenance:   Alizzon was seen today for annual exam.  Diagnoses and all orders for this visit:  Encounter for health maintenance examination in adult-  Women's Health Maintenance Plan Advised monthly breast exam and annual mammogram Advised dental exam every six months Discussed stress management Discussed pap smear screening guidelines  Breast cancer screening- discussed cancer screening -     MM Digital Screening  Encounter for hepatitis C screening test for low risk patient -     Hepatitis C Antibody  Screening for HIV (human immunodeficiency virus) -     HIV antibody (with reflex)  Screening for cervical cancer -     Cancel: Cytology -  PAP([place]) -     Cytology - PAP  Dyslipidemia -     CMP14+EGFR -     Lipid panel    Return in about 6 months (around 07/06/2019) for hypertension .    Body mass index is 33.58 kg/m.:  Discussed the patient's BMI with patient. The  BMI body mass index is 33.58 kg/m.     No future appointments.  Patient Instructions   We recommend that you schedule a mammogram for breast cancer screening. Typically, you do not need a referral to do this. Please contact a local imaging center to schedule your mammogram.   [facility] ([place]) - [phone] or [phone]      If you have lab work done today you will be contacted with your lab results within the next 2 weeks.  If you have not heard from [place] then please contact us. The fastest way to get your results is to register for My Chart.   IF you received an x-ray today, you will receive an invoice from [facility]. Please contact [facility] at [phone] with questions or concerns regarding your invoice.   IF you received labwork today, you will receive an invoice from [place]. Please contact [facility] at [phone] with questions or concerns regarding your invoice.   Our billing staff will not be able to assist you with questions regarding bills from these companies.  You will be contacted with the lab results as soon as they are available. The fastest way to get your results is to activate your My Chart account. Instructions are located on the last page of this paperwork. If you have not heard from [place] regarding the results in 2 weeks, please contact this office.

## 2019-01-05 LAB — HEPATITIS C ANTIBODY: Hep C Virus Ab: <0.1 s/co ratio (ref 0.0–0.9)

## 2019-01-05 LAB — CMP14+EGFR
ALT: 20 IU/L (ref 0–32)
AST: 22 IU/L (ref 0–40)
Albumin/Globulin Ratio: 2.1 (ref 1.2–2.2)
Albumin: 4.9 g/dL (ref 3.8–4.9)
Alkaline Phosphatase: 55 IU/L (ref 39–117)
BUN/Creatinine Ratio: 24 — ABNORMAL HIGH (ref 9–23)
BUN: 19 mg/dL (ref 6–24)
Bilirubin Total: 0.3 mg/dL (ref 0.0–1.2)
CO2: 22 mmol/L (ref 20–29)
Calcium: 9.9 mg/dL (ref 8.7–10.2)
Chloride: 104 mmol/L (ref 96–106)
Creatinine, Ser: 0.78 mg/dL (ref 0.57–1.00)
GFR calc Af Amer: 96 mL/min/{1.73_m2} (ref >59)
GFR calc non Af Amer: 83 mL/min/{1.73_m2} (ref >59)
Globulin, Total: 2.3 g/dL (ref 1.5–4.5)
Glucose: 101 mg/dL — ABNORMAL HIGH (ref 65–99)
Potassium: 3.8 mmol/L (ref 3.5–5.2)
Sodium: 141 mmol/L (ref 134–144)
Total Protein: 7.2 g/dL (ref 6.0–8.5)

## 2019-01-05 LAB — LIPID PANEL
Chol/HDL Ratio: 4.3 ratio (ref 0.0–4.4)
Cholesterol, Total: 218 mg/dL — ABNORMAL HIGH (ref 100–199)
HDL: 51 mg/dL (ref >39)
LDL Calculated: 141 mg/dL — ABNORMAL HIGH (ref 0–99)
Triglycerides: 130 mg/dL (ref 0–149)
VLDL Cholesterol Cal: 26 mg/dL (ref 5–40)

## 2019-01-05 LAB — HIV ANTIBODY (ROUTINE TESTING W REFLEX): HIV Screen 4th Generation wRfx: NONREACTIVE

## 2019-01-06 LAB — CYTOLOGY - PAP: Diagnosis: NEGATIVE

## 2019-01-10 ENCOUNTER — Encounter: Payer: Self-pay | Admitting: *Deleted

## 2019-01-19 ENCOUNTER — Other Ambulatory Visit: Payer: Self-pay | Admitting: Emergency Medicine

## 2019-01-19 DIAGNOSIS — I10 Essential (primary) hypertension: Secondary | ICD-10-CM

## 2019-02-02 ENCOUNTER — Other Ambulatory Visit: Payer: Self-pay | Admitting: Family Medicine

## 2019-02-02 DIAGNOSIS — Z20822 Contact with and (suspected) exposure to covid-19: Secondary | ICD-10-CM

## 2019-02-06 LAB — NOVEL CORONAVIRUS, NAA: SARS-CoV-2, NAA: NOT DETECTED

## 2019-06-13 ENCOUNTER — Other Ambulatory Visit: Payer: Self-pay

## 2019-06-13 DIAGNOSIS — Z20822 Contact with and (suspected) exposure to covid-19: Secondary | ICD-10-CM

## 2019-06-14 LAB — NOVEL CORONAVIRUS, NAA: SARS-CoV-2, NAA: DETECTED — AB

## 2019-06-15 ENCOUNTER — Telehealth: Payer: Self-pay | Admitting: Nurse Practitioner

## 2019-06-15 NOTE — Telephone Encounter (Signed)
Called to Discuss with patient about Covid symptoms and the use of bamlanivimab, a monoclonal antibody infusion for those with mild to moderate Covid symptoms and at a high risk of hospitalization.     Pt is qualified for this infusion at the [facility] infusion center due to co-morbid conditions and/or a member of an at-risk group.    The followings problems are being managed:  Patient Active Problem List   Diagnosis Date Noted  . Post-menopausal bleeding 11/02/2014  . Health maintenance examination 04/14/2014  . Hand pain 01/12/2014  . Hypertension, essential   . Class 1 obesity due to excess calories with serious comorbidity and body mass index (BMI) of 33.0 to 33.9 in adult     Patient is not having any symptoms and did not qualify for treatment.

## 2019-07-04 ENCOUNTER — Ambulatory Visit (INDEPENDENT_AMBULATORY_CARE_PROVIDER_SITE_OTHER): Payer: Self-pay | Admitting: Family Medicine

## 2019-07-04 ENCOUNTER — Encounter: Payer: Self-pay | Admitting: Family Medicine

## 2019-07-04 ENCOUNTER — Other Ambulatory Visit: Payer: Self-pay

## 2019-07-04 VITALS — BP 138/92 | HR 97 | Temp 97.7°F | Resp 16 | Ht 62.0 in | Wt 184.0 lb

## 2019-07-04 DIAGNOSIS — E785 Hyperlipidemia, unspecified: Secondary | ICD-10-CM

## 2019-07-04 DIAGNOSIS — I10 Essential (primary) hypertension: Secondary | ICD-10-CM

## 2019-07-04 LAB — CMP14+EGFR
ALT: 28 IU/L (ref 0–32)
AST: 25 IU/L (ref 0–40)
Albumin/Globulin Ratio: 1.6 (ref 1.2–2.2)
Albumin: 4.6 g/dL (ref 3.8–4.9)
Alkaline Phosphatase: 59 IU/L (ref 39–117)
BUN/Creatinine Ratio: 20 (ref 9–23)
BUN: 16 mg/dL (ref 6–24)
Bilirubin Total: 0.2 mg/dL (ref 0.0–1.2)
CO2: 22 mmol/L (ref 20–29)
Calcium: 10.3 mg/dL — ABNORMAL HIGH (ref 8.7–10.2)
Chloride: 100 mmol/L (ref 96–106)
Creatinine, Ser: 0.8 mg/dL (ref 0.57–1.00)
GFR calc Af Amer: 93 mL/min/1.73 (ref >59)
GFR calc non Af Amer: 81 mL/min/1.73 (ref >59)
Globulin, Total: 2.8 g/dL (ref 1.5–4.5)
Glucose: 96 mg/dL (ref 65–99)
Potassium: 4.3 mmol/L (ref 3.5–5.2)
Sodium: 139 mmol/L (ref 134–144)
Total Protein: 7.4 g/dL (ref 6.0–8.5)

## 2019-07-04 LAB — LIPID PANEL
Chol/HDL Ratio: 4.3 ratio (ref 0.0–4.4)
Cholesterol, Total: 251 mg/dL — ABNORMAL HIGH (ref 100–199)
HDL: 58 mg/dL (ref >39)
LDL Chol Calc (NIH): 170 mg/dL — ABNORMAL HIGH (ref 0–99)
Triglycerides: 129 mg/dL (ref 0–149)
VLDL Cholesterol Cal: 23 mg/dL (ref 5–40)

## 2019-07-04 NOTE — Patient Instructions (Signed)
° ° ° °  If you have lab work done today you will be contacted with your lab results within the next 2 weeks.  If you have not heard from us then please contact us. The fastest way to get your results is to register for My Chart. ° ° °IF you received an x-ray today, you will receive an invoice from [facility]. Please contact [facility] at [phone] with questions or concerns regarding your invoice.  ° °IF you received labwork today, you will receive an invoice from [facility]. Please contact [facility] at [phone] with questions or concerns regarding your invoice.  ° °Our billing staff will not be able to assist you with questions regarding bills from these companies. ° °You will be contacted with the lab results as soon as they are available. The fastest way to get your results is to activate your My Chart account. Instructions are located on the last page of this paperwork. If you have not heard from us regarding the results in 2 weeks, please contact this office. °  ° ° ° °

## 2019-07-04 NOTE — Progress Notes (Signed)
Established Patient Office Visit  Subjective:  Patient ID: Priscilla Lane, female    DOB: Dec 08, 1960  Age: 58 y.o. MRN: [id number]  CC:  Chief Complaint  Patient presents with  . Hypertension    6 month f/u    HPI [name] presents for   Hypertension: Patient here for follow-up of elevated blood pressure. She is exercising and is adherent to low salt diet.  Blood pressure is well controlled at home. Cardiac symptoms none. Patient denies chest pain, chest pressure/discomfort, claudication, dyspnea, exertional chest pressure/discomfort, fatigue and irregular heart beat.  Cardiovascular risk factors: dyslipidemia and hypertension. Use of agents associated with hypertension: none. History of target organ damage: none. BP Readings from Last 3 Encounters:  07/04/19 (!) 138/92  01/04/19 130/80  07/01/18 136/84    Wt Readings from Last 3 Encounters:  07/04/19 184 lb (83.5 kg)  01/04/19 183 lb 9.6 oz (83.3 kg)  07/01/18 183 lb 12.8 oz (83.4 kg)   Dyslipidemia: Patient presents for evaluation of lipids.  Compliance with treatment thus far has been excellent.  A repeat fasting lipid profile was done.  The patient does not use medications that may worsen dyslipidemias (corticosteroids, progestins, anabolic steroids, diuretics, beta-blockers, amiodarone, cyclosporine, olanzapine). The patient exercises daily.  The patient is not known to have coexisting coronary artery disease.   The 10-year ASCVD risk score [name] DC [name]., et al., 2013) is: 9.1%   Values used to calculate the score:     Age: 61 years     Sex: Female     Is Non-Hispanic African American: Yes     Diabetic: No     Tobacco smoker: No     Systolic Blood Pressure: 086 mmHg     Is BP treated: Yes     HDL Cholesterol: 51 mg/dL     Total Cholesterol: 218 mg/dL      Past Medical History:  Diagnosis Date  . Allergic rhinitis    pollen  . Hypertension, essential   . Obesity     Past Surgical History:    Procedure Laterality Date  . ABDOMINOPLASTY  2003  . 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 saline infusion [place]     for postmenopausal bleeding/fibroids/thickened endometrium ([place])    Family History  Problem Relation Age of Onset  . Diabetes Mother   . CAD Mother 28       CABG  . Hypertension Mother   . Kidney disease Mother        ESRD on HD  . Colon polyps Father   . Cancer Maternal Grandmother 77       GYN  . Ovarian cancer Maternal Grandmother   . Cancer Maternal Grandfather        HEENT  . Colon cancer Neg Hx   . Rectal cancer Neg Hx   . Stomach cancer Neg Hx     Social History   Socioeconomic History  . Marital status: Married    Spouse name: Not on file  . Number of children: Not on file  . Years of education: Not on file  . Highest education level: Not on file  Occupational History  . Not on file  Tobacco Use  . Smoking status: Never Smoker  . Smokeless tobacco: Never Used  Substance and Sexual Activity  . Alcohol use: No    Alcohol/week: 0.0 standard drinks  . Drug use: No  . Sexual activity: Never  Other  Topics Concern  . 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 Difficulty of Paying Living Expenses: Not on file  Food Insecurity:   . Worried About Charity fundraiser in the Last Year: Not on file  . Ran Out of Food in the Last Year: Not on file  Transportation Needs:   . Lack of Transportation (Medical): Not on file  . Lack of Transportation (Non-Medical): Not on file  Physical Activity:   . Days of Exercise per Week: Not on file  . Minutes of Exercise per Session: Not on file  Stress:   . Feeling of Stress : Not on file  Social  Connections:   . Frequency of Communication with Friends and Family: Not on file  . Frequency of Social Gatherings with Friends and Family: Not on file  . Attends Religious Services: Not on file  . Active Member of Clubs or Organizations: Not on file  . Attends Archivist Meetings: Not on file  . Marital Status: Not on file  Intimate Partner Violence:   . Fear of Current or Ex-Partner: Not on file  . Emotionally Abused: Not on file  . Physically Abused: Not on file  . Sexually Abused: Not on file    Outpatient Medications Prior to Visit  Medication Sig Dispense Refill  . hydrochlorothiazide (HYDRODIURIL) 25 MG tablet TAKE 1 TABLET(25 MG) BY MOUTH DAILY 90 tablet 3  . albuterol (PROVENTIL HFA;VENTOLIN HFA) 108 (90 Base) MCG/ACT inhaler Inhale 2 puffs into the lungs every 6 (six) hours as needed for wheezing or shortness of breath. (Patient not taking: Reported on 01/04/2019) 1 Inhaler 0  . aspirin 81 MG tablet Take 1 tablet (81 mg total) by mouth daily. (Patient not taking: Reported on 07/04/2019) 90 tablet 3  . cetirizine (ZYRTEC) 10 MG tablet Take 1 tablet (10 mg total) by mouth daily. (Patient not taking: Reported on 01/04/2019) 30 tablet 11  . fluticasone (FLONASE) 50 MCG/ACT nasal spray Place 2 sprays into both nostrils daily. (Patient not taking: Reported on 01/04/2019) 16 g 6   No facility-administered medications prior to visit.    Allergies  Allergen Reactions  . Amlodipine     Lower extremity edema  . Lisinopril     Cough, muscle cramps  . Cephalexin Rash    Taking cephalexin for 2 days and is broken out in a facial rash with swelling of cheeks and eyelids. Incidentally, was also taking some ibuprofen at the same time but she has tolerated that well in the past and it probably is not the allergen.    ROS Review of Systems Review of Systems  Constitutional: Negative for activity change, appetite change, chills and fever.  HENT: Negative for congestion,  nosebleeds, trouble swallowing and voice change.   Respiratory: Negative for cough, shortness of breath and wheezing.   Gastrointestinal: Negative for diarrhea, nausea and vomiting.  Genitourinary: Negative for difficulty urinating, dysuria, flank pain and hematuria.  Musculoskeletal: Negative for back pain, joint swelling and neck pain.  Neurological: Negative for dizziness, speech difficulty, light-headedness and numbness.  See HPI. All other review of systems negative.     Objective:    Physical Exam  BP (!) 138/92 (BP Location:  Left Arm, Patient Position: Sitting, Cuff Size: Large)   Pulse 97   Temp 97.7 F (36.5 C) (Oral)   Resp 16   Ht _0  (1.575 m)   Wt 184 lb (83.5 kg)   SpO2 97%   BMI 33.65 kg/m  Wt Readings from Last 3 Encounters:  07/04/19 184 lb (83.5 kg)  01/04/19 183 lb 9.6 oz (83.3 kg)  07/01/18 183 lb 12.8 oz (83.4 kg)   Physical Exam  Constitutional: Oriented to person, place, and time. Appears well-developed and well-nourished.  HENT:  Head: Normocephalic and atraumatic.  Eyes: Conjunctivae and EOM are normal.  Cardiovascular: Normal rate, regular rhythm, normal heart sounds and intact distal pulses.  No murmur heard. Pulmonary/Chest: Effort normal and breath sounds normal. No stridor. No respiratory distress. Has no wheezes.  Neurological: Is alert and oriented to person, place, and time.  Skin: Skin is warm. Capillary refill takes less than 2 seconds.  Psychiatric: Has a normal mood and affect. Behavior is normal. Judgment and thought content normal.    Health Maintenance Due  Topic Date Due  . MAMMOGRAM  06/05/2016    There are no preventive care reminders to display for this patient.  Lab Results  Component Value Date   TSH 3.50 04/10/2014   Lab Results  Component Value Date   WBC 4.8 12/10/2016   HGB 11.5 12/10/2016   HCT 34.8 12/10/2016   MCV 91 12/10/2016   PLT 227 12/10/2016   Lab Results  Component Value Date   NA 141  01/04/2019   K 3.8 01/04/2019   CO2 22 01/04/2019   GLUCOSE 101 (H) 01/04/2019   BUN 19 01/04/2019   CREATININE 0.78 01/04/2019   BILITOT 0.3 01/04/2019   ALKPHOS 55 01/04/2019   AST 22 01/04/2019   ALT 20 01/04/2019   PROT 7.2 01/04/2019   ALBUMIN 4.9 01/04/2019   CALCIUM 9.9 01/04/2019   GFR 117.77 04/10/2014   Lab Results  Component Value Date   CHOL 218 (H) 01/04/2019   Lab Results  Component Value Date   HDL 51 01/04/2019   Lab Results  Component Value Date   LDLCALC 141 (H) 01/04/2019   Lab Results  Component Value Date   TRIG 130 01/04/2019   Lab Results  Component Value Date   CHOLHDL 4.3 01/04/2019   Lab Results  Component Value Date   HGBA1C 5.6 03/06/2015      Assessment & Plan:   Problem List Items Addressed This Visit      Cardiovascular and Mediastinum   Hypertension, essential - Patient's blood pressure is at goal of 139/89 or less. Condition is stable. Continue current medications and treatment plan. I recommend that you exercise for 30-45 minutes 5 days a week. I also recommend a balanced diet with fruits and vegetables every day, lean meats, and little fried foods. The DASH diet (you can find this online) is a good example of this.    Relevant Orders   Lipid panel   CMP14+EGFR    Other Visit Diagnoses    Dyslipidemia    -  Primary Discussed medications that affect lipids Reminded patient to avoid grapefruits Reviewed last 3 lipids Discussed current meds: statin, aspirin Advised dietary fiber and fish oil and ways to keep HDL high CAD prevention and reviewed side effects of statins Patient is a nonsmoker.    Relevant Orders   Lipid panel   CMP14+EGFR      No orders of the defined types were placed in  this encounter.   Follow-up: No follow-ups on file.    Forrest Moron, MD

## 2019-07-05 MED ORDER — PRAVASTATIN SODIUM 20 MG PO TABS: 20.0000 mg | ORAL_TABLET | Freq: Every day | ORAL | 0 refills | Status: DC

## 2019-10-07 ENCOUNTER — Telehealth (INDEPENDENT_AMBULATORY_CARE_PROVIDER_SITE_OTHER): Payer: Self-pay | Admitting: Family Medicine

## 2019-10-07 ENCOUNTER — Other Ambulatory Visit: Payer: Self-pay

## 2019-10-07 VITALS — BP 122/78 | Ht 63.0 in | Wt 174.0 lb

## 2019-10-07 DIAGNOSIS — I10 Essential (primary) hypertension: Secondary | ICD-10-CM

## 2019-10-07 DIAGNOSIS — Z5181 Encounter for therapeutic drug level monitoring: Secondary | ICD-10-CM

## 2019-10-07 NOTE — Patient Instructions (Signed)
° ° ° °  If you have lab work done today you will be contacted with your lab results within the next 2 weeks.  If you have not heard from us then please contact us. The fastest way to get your results is to register for My Chart. ° ° °IF you received an x-ray today, you will receive an invoice from [facility]. Please contact [facility] at [phone] with questions or concerns regarding your invoice.  ° °IF you received labwork today, you will receive an invoice from [facility]. Please contact [facility] at [phone] with questions or concerns regarding your invoice.  ° °Our billing staff will not be able to assist you with questions regarding bills from these companies. ° °You will be contacted with the lab results as soon as they are available. The fastest way to get your results is to activate your My Chart account. Instructions are located on the last page of this paperwork. If you have not heard from us regarding the results in 2 weeks, please contact this office. °  ° ° ° °

## 2019-10-07 NOTE — Progress Notes (Signed)
Telemedicine Encounter- SOAP NOTE Established Patient  This telephone encounter was conducted with the patient's (or proxy's) verbal consent via audio telecommunications: yes/no: Yes Patient was instructed to have this encounter in a suitably private space; and to only have persons present to whom they give permission to participate. In addition, patient identity was confirmed by use of name plus two identifiers (DOB and address).  I discussed the limitations, risks, security and privacy concerns of performing an evaluation and management service by telephone and the availability of in person appointments. I also discussed with the patient that there may be a patient responsible charge related to this service. The patient expressed understanding and agreed to proceed.  I spent a total of TIME; 0 MIN TO 60 MIN: 15 minutes talking with the patient or their proxy.  Chief Complaint  Patient presents with  . Follow-up    [id number] for BP    Subjective   Priscilla Lane is a 59 y.o. established patient. Telephone visit today for  HPI  Hypertension: Patient here for follow-up of elevated blood pressure. She is exercising and is adherent to low salt diet.  Blood pressure is well controlled at home. Cardiac symptoms none. Patient denies chest pain, claudication, exertional chest pressure/discomfort, fatigue and lower extremity edema.  Cardiovascular risk factors: hypertension and obesity (BMI >= 30 kg/m2). Use of agents associated with hypertension: none. History of target organ damage: none. HCTZ 25mg    Wt Readings from Last 3 Encounters:  10/07/19 174 lb (78.9 kg)  07/04/19 184 lb (83.5 kg)  01/04/19 183 lb 9.6 oz (83.3 kg)    Patient Active Problem List   Diagnosis Date Noted  . Post-menopausal bleeding 11/02/2014  . Health maintenance examination 04/14/2014  . Hand pain 01/12/2014  . Hypertension, essential   . Class 1 obesity due to excess calories with serious comorbidity and body  mass index (BMI) of 33.0 to 33.9 in adult     Past Medical History:  Diagnosis Date  . Allergic rhinitis    pollen  . Hypertension, essential   . Obesity     Current Outpatient Medications  Medication Sig Dispense Refill  . albuterol (PROVENTIL HFA;VENTOLIN HFA) 108 (90 Base) MCG/ACT inhaler Inhale 2 puffs into the lungs every 6 (six) hours as needed for wheezing or shortness of breath. 1 Inhaler 0  . aspirin 81 MG tablet Take 1 tablet (81 mg total) by mouth daily. 90 tablet 3  . cetirizine (ZYRTEC) 10 MG tablet Take 1 tablet (10 mg total) by mouth daily. 30 tablet 11  . fluticasone (FLONASE) 50 MCG/ACT nasal spray Place 2 sprays into both nostrils daily. 16 g 6  . hydrochlorothiazide (HYDRODIURIL) 25 MG tablet TAKE 1 TABLET(25 MG) BY MOUTH DAILY 90 tablet 3  . pravastatin (PRAVACHOL) 20 MG tablet Take 1 tablet (20 mg total) by mouth daily. 90 tablet 0   No current facility-administered medications for this visit.    Allergies  Allergen Reactions  . Amlodipine     Lower extremity edema  . Lisinopril     Cough, muscle cramps  . Cephalexin Rash    Taking cephalexin for 2 days and is broken out in a facial rash with swelling of cheeks and eyelids. Incidentally, was also taking some ibuprofen at the same time but she has tolerated that well in the past and it probably is not the allergen.    Social History   Socioeconomic History  . Marital status: Married    Spouse name: Not  on file  . Number of children: Not on file  . Years of education: Not on file  . Highest education level: Not on file  Occupational History  . Not on file  Tobacco Use  . Smoking status: Never Smoker  . Smokeless tobacco: Never Used  Substance and Sexual Activity  . Alcohol use: No    Alcohol/week: 0.0 standard drinks  . Drug use: No  . Sexual activity: Never  Other Topics Concern  . 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 Difficulty of Paying Living Expenses:   Food Insecurity:   . Worried About Charity fundraiser in the Last Year:   . Arboriculturist in the Last Year:   Transportation Needs:   . Film/video editor (Medical):   Marland Kitchen Lack of Transportation (Non-Medical):   Physical Activity:   . Days of Exercise per Week:   . Minutes of Exercise per Session:   Stress:   . Feeling of Stress :   Social Connections:   . Frequency of Communication with Friends and Family:   . Frequency of Social Gatherings with Friends and Family:   . Attends Religious Services:   . Active Member of Clubs or Organizations:   . Attends Archivist Meetings:   Marland Kitchen Marital Status:   Intimate Partner Violence:   . Fear of Current or Ex-Partner:   . Emotionally Abused:   Marland Kitchen Physically Abused:   . Sexually Abused:     ROS  Objective   Vitals as reported by the patient: Today's Vitals   10/07/19 0746 10/07/19 0748 10/07/19 1101  BP: (!) 158/87 (!) 159/88 122/78  Weight: 174 lb (78.9 kg)    Height: 5\' 3"  (1.6 m)     Physical Exam  Constitutional: Oriented to person, place, and time. Appears well-developed and well-nourished.  HENT:  Head: Normocephalic and atraumatic.  Eyes: Conjunctivae and EOM are normal.  Pulmonary/Chest: Effort normal. No respiratory distress.   Neurological: Is alert and oriented to person, place, and time.  Skin: Skin is warm. Capillary refill takes less than 2 seconds.  Psychiatric: Has a normal mood and affect. Behavior is normal. Judgment and thought content normal.   [name] was seen today for follow-up.  Diagnoses and all orders for this visit:  Hypertension, essential  Encounter for medication monitoring  Patient's blood pressure is at goal of 139/89 or less.  Condition is stable. Continue current medications and treatment plan. I recommend that you exercise for 30-45 minutes 5 days a week. I also recommend a balanced diet with fruits and vegetables every day, lean meats, and little fried foods. The DASH diet (you can find this online) is a good example of this.    I discussed the assessment and treatment plan with the patient. The patient was provided an opportunity to ask questions and all were answered. The patient agreed with the plan and demonstrated an understanding of the instructions.   The patient was advised to call back or seek an in-person evaluation if the symptoms worsen or if the condition fails to improve as anticipated.  I provided 15 minutes of VIDEO face-to-face time during this encounter.  Forrest Moron, MD  [facility] at [facility]

## 2020-01-20 ENCOUNTER — Other Ambulatory Visit: Payer: Self-pay

## 2020-01-20 DIAGNOSIS — I10 Essential (primary) hypertension: Secondary | ICD-10-CM

## 2020-01-20 MED ORDER — HYDROCHLOROTHIAZIDE 25 MG PO TABS: ORAL_TABLET | ORAL | 0 refills | Status: DC

## 2020-04-26 ENCOUNTER — Other Ambulatory Visit: Payer: Self-pay | Admitting: Obstetrics and Gynecology

## 2020-04-26 DIAGNOSIS — Z1231 Encounter for screening mammogram for malignant neoplasm of breast: Secondary | ICD-10-CM

## 2020-05-31 ENCOUNTER — Ambulatory Visit: Payer: Self-pay

## 2020-05-31 ENCOUNTER — Inpatient Hospital Stay: Admission: RE | Admit: 2020-05-31 | Payer: Self-pay | Source: Ambulatory Visit

## 2020-12-01 ENCOUNTER — Ambulatory Visit
Admission: RE | Admit: 2020-12-01 | Discharge: 2020-12-01 | Disposition: A | Discharge status: Home / Self Care | Payer: Self-pay | Source: Ambulatory Visit | Attending: Family Medicine | Admitting: Family Medicine

## 2020-12-01 ENCOUNTER — Other Ambulatory Visit: Payer: Self-pay

## 2020-12-01 VITALS — BP 183/114 | HR 83 | Temp 98.1°F | Resp 16

## 2020-12-01 DIAGNOSIS — I10 Essential (primary) hypertension: Secondary | ICD-10-CM

## 2020-12-01 MED ORDER — VERAPAMIL HCL 40 MG PO TABS: 40.0000 mg | ORAL_TABLET | Freq: Two times a day (BID) | ORAL | 0 refills | Status: DC

## 2020-12-01 MED ORDER — HYDROCHLOROTHIAZIDE 12.5 MG PO CAPS: 12.5000 mg | ORAL_CAPSULE | Freq: Every day | ORAL | 0 refills | Status: DC

## 2020-12-01 NOTE — ED Triage Notes (Signed)
Patient presents to Urgent Care for BP medication refill. She does not have PCP at this time. She states she has had new stressors in her life so her BP has not been under control with her meds. Last BP 160's.

## 2020-12-01 NOTE — ED Provider Notes (Signed)
[facility]    CSN: [id number] Arrival date & time: 12/01/20  0840      History   Chief Complaint Chief Complaint  Patient presents with  . Medication Refill    HPI Priscilla Lane is a 60 y.o. female.   HPI  Patient presents today for management of hypertension.  Patient's been without PCP care for greater than 2 years.  Blood pressure on arrival was 183/114 with multiple checks.  She has been checking at home and blood pressure systolically has been greater than 160/100. She is asymptomatic of chest pain, shortness of breath, swelling, headache or visual disturbances.She has a PCP appointment scheduled in mid July however grew concerned as her blood pressure is currently not managed with current prescription of hydrochlorothiazide 25 mg.    Past Medical History:  Diagnosis Date  . Allergic rhinitis    pollen  . Hypertension, essential   . Obesity     Patient Active Problem List   Diagnosis Date Noted  . Post-menopausal bleeding 11/02/2014  . Health maintenance examination 04/14/2014  . Hand pain 01/12/2014  . Hypertension, essential   . Class 1 obesity due to excess calories with serious comorbidity and body mass index (BMI) of 33.0 to 33.9 in adult     Past Surgical History:  Procedure Laterality Date  . ABDOMINOPLASTY  2003  . 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 saline infusion [place]     for postmenopausal bleeding/fibroids/thickened endometrium ([place])    OB History   No obstetric history on file.      Home Medications    Prior to Admission medications   Medication Sig Start Date End Date Taking? Authorizing Provider  albuterol (PROVENTIL HFA;VENTOLIN HFA) 108 (90 Base) MCG/ACT inhaler Inhale 2 puffs into the lungs every 6 (six) hours as needed for wheezing or shortness of breath. 06/12/18   Rodriguez-Southworth, Sunday Spillers, PA-C  aspirin 81 MG tablet Take 1 tablet (81 mg total) by mouth daily.  08/14/16   Forrest Moron, MD  cetirizine (ZYRTEC) 10 MG tablet Take 1 tablet (10 mg total) by mouth daily. 12/10/16   Forrest Moron, MD  fluticasone (FLONASE) 50 MCG/ACT nasal spray Place 2 sprays into both nostrils daily. 12/10/16   Forrest Moron, MD  hydrochlorothiazide (HYDRODIURIL) 25 MG tablet TAKE 1 TABLET(25 MG) BY MOUTH DAILY 01/20/20   Jacelyn Pi, Lilia Argue, MD  pravastatin (PRAVACHOL) 20 MG tablet Take 1 tablet (20 mg total) by mouth daily. 07/05/19   Forrest Moron, MD    Family History Family History  Problem Relation Age of Onset  . Diabetes Mother   . CAD Mother 76       CABG  . Hypertension Mother   . Kidney disease Mother        ESRD on HD  . Colon polyps Father   . Cancer Maternal Grandmother 52       GYN  . Ovarian cancer Maternal Grandmother   . Cancer Maternal Grandfather        HEENT  . Colon cancer Neg Hx   . Rectal cancer Neg Hx   . Stomach cancer Neg Hx     Social History Social History   Tobacco Use  . Smoking status: Never Smoker  . Smokeless tobacco: Never Used  Vaping Use  . Vaping Use: Never used  Substance Use Topics  . Alcohol use: No    Alcohol/week: 0.0 standard drinks  .  Drug use: No     Allergies   Amlodipine, Lisinopril, and Cephalexin   Review of Systems Review of Systems Pertinent negatives listed in HPI   Physical Exam Triage Vital Signs ED Triage Vitals  Enc Vitals Group     BP 12/01/20 0903 (S) (!) 183/114     Pulse Rate 12/01/20 0903 83     Resp 12/01/20 0903 16     Temp 12/01/20 0903 98.1 F (36.7 C)     Temp Source 12/01/20 0903 Oral     SpO2 12/01/20 0903 98 %     Weight --      Height --      Head Circumference --      Peak Flow --      Pain Score 12/01/20 0900 0     Pain Loc --      Pain Edu? --      Excl. in [place]? --    No data found.  Updated Vital Signs BP (S) (!) 183/114 (BP Location: Left Arm)   Pulse 83   Temp 98.1 F (36.7 C) (Oral)   Resp 16   SpO2 98%   Visual Acuity Right Eye  Distance:   Left Eye Distance:   Bilateral Distance:    Right Eye Near:   Left Eye Near:    Bilateral Near:     Physical Exam Physical Exam: Constitutional: Patient appears well-developed and well-nourished. No distress. HENT: Normocephalic, atraumatic Eyes: Conjunctivae and EOM are normal. PERRLA, no scleral icterus. Neck: Normal ROM. Neck supple. No JVD. No tracheal deviation. No thyromegaly. CVS: RRR, S1/S2 +, no murmurs, no gallops, no carotid bruit.  Pulmonary: Effort and breath sounds normal, no stridor, rhonchi, wheezes, rales.  Abdominal: Soft. BS +, no distension, tenderness, rebound or guarding.  Musculoskeletal: Normal range of motion. No edema and no tenderness.  Neuro: Alert. Normal reflexes, muscle tone coordination. No cranial nerve deficit. Skin: Skin is warm and dry. No rash noted. Not diaphoretic. No erythema. No pallor. Psychiatric: Normal mood and affect. Behavior, judgment, thought content normal.   UC Treatments / Results  Labs (all labs ordered are listed, but only abnormal results are displayed) Labs Reviewed - No data to display  EKG Sinus rhythm changes, no ST changes      Radiology No results found.  Procedures Procedures (including critical care time)  Medications Ordered in UC Medications - No data to display  Initial Impression / Assessment and Plan / UC Course  I have reviewed the triage vital signs and the nursing notes.  Pertinent labs & imaging results that were available during my care of the patient were reviewed by me and considered in my medical decision making (see chart for details).    Patient presents today with accelerated hypertension she is been lost to follow-up with her primary care doctor since 2020 since COVID-19.  She has been taking hydrochlorothiazide 25 mg daily which was previously prescribed to her and did control her blood pressure at that time. She has had reactions to ACE, amlodipine previously. Current pulse  per ECG 60's-will avoid BB.Trial low dose verapamil and decreased HCTZ 12.5 mg. Encourage logging BP  Readings for review at PCP visit in July.  Strict ER precautions given.  Keep follow-up appointment with new PCP. Final Clinical Impressions(s) / UC Diagnoses   Final diagnoses:  Accelerated hypertension     Discharge Instructions     While starting the new medication start checking her blood pressure at the same time  daily and recording your readings.  When starting a new blood pressure medication you can often have dizziness it is highly recommended that you take your hydrochlorothiazide the medication you are currently on in the morning and take your verapamil prior to bedtime to avoid positional dizziness. I will receive your lab work back within the next 24 to 48 hours we will notify you via MyChart or via phone if any changes in treatment are warranted.    ED Prescriptions    Medication Sig Dispense Auth. Provider   hydrochlorothiazide (MICROZIDE) 12.5 MG capsule Take 1 capsule (12.5 mg total) by mouth daily. 60 capsule Scot Jun, FNP   verapamil (CALAN) 40 MG tablet Take 1 tablet (40 mg total) by mouth 2 (two) times daily. 180 tablet Scot Jun, FNP     PDMP not reviewed this encounter.   Scot Jun, FNP 12/04/20 2133

## 2020-12-01 NOTE — Discharge Instructions (Addendum)
While starting the new medication start checking her blood pressure at the same time daily and recording your readings.  When starting a new blood pressure medication you can often have dizziness it is highly recommended that you take your hydrochlorothiazide the medication you are currently on in the morning and take your verapamil prior to bedtime to avoid positional dizziness. I will receive your lab work back within the next 24 to 48 hours we will notify you via MyChart or via phone if any changes in treatment are warranted.

## 2020-12-03 LAB — BASIC METABOLIC PANEL
BUN/Creatinine Ratio: 21 (ref 12–28)
BUN: 16 mg/dL (ref 8–27)
CO2: 25 mmol/L (ref 20–29)
Calcium: 10.3 mg/dL (ref 8.7–10.3)
Chloride: 102 mmol/L (ref 96–106)
Creatinine, Ser: 0.78 mg/dL (ref 0.57–1.00)
Glucose: 90 mg/dL (ref 65–99)
Potassium: 4.3 mmol/L (ref 3.5–5.2)
Sodium: 141 mmol/L (ref 134–144)
eGFR: 86 mL/min/{1.73_m2} (ref >59)

## 2021-01-25 ENCOUNTER — Other Ambulatory Visit: Payer: Self-pay | Admitting: Obstetrics and Gynecology

## 2021-01-25 DIAGNOSIS — Z1231 Encounter for screening mammogram for malignant neoplasm of breast: Secondary | ICD-10-CM

## 2021-01-31 ENCOUNTER — Other Ambulatory Visit: Payer: Self-pay

## 2021-01-31 ENCOUNTER — Ambulatory Visit (INDEPENDENT_AMBULATORY_CARE_PROVIDER_SITE_OTHER): Payer: Self-pay | Admitting: Emergency Medicine

## 2021-01-31 ENCOUNTER — Encounter: Payer: Self-pay | Admitting: Emergency Medicine

## 2021-01-31 VITALS — BP 162/80 | HR 91 | Temp 99.2°F | Ht 63.0 in | Wt 160.0 lb

## 2021-01-31 DIAGNOSIS — Z7689 Persons encountering health services in other specified circumstances: Secondary | ICD-10-CM

## 2021-01-31 DIAGNOSIS — Z113 Encounter for screening for infections with a predominantly sexual mode of transmission: Secondary | ICD-10-CM

## 2021-01-31 DIAGNOSIS — I10 Essential (primary) hypertension: Secondary | ICD-10-CM

## 2021-01-31 DIAGNOSIS — F4323 Adjustment disorder with mixed anxiety and depressed mood: Secondary | ICD-10-CM | POA: Insufficient documentation

## 2021-01-31 LAB — COMPREHENSIVE METABOLIC PANEL
ALT: 16 U/L (ref 0–35)
AST: 17 U/L (ref 0–37)
Albumin: 4.5 g/dL (ref 3.5–5.2)
Alkaline Phosphatase: 53 U/L (ref 39–117)
BUN: 15 mg/dL (ref 6–23)
CO2: 27 mEq/L (ref 19–32)
Calcium: 9.8 mg/dL (ref 8.4–10.5)
Chloride: 102 mEq/L (ref 96–112)
Creatinine, Ser: 0.74 mg/dL (ref 0.40–1.20)
GFR: 87.43 mL/min (ref >60.00)
Glucose, Bld: 83 mg/dL (ref 70–99)
Potassium: 3.8 mEq/L (ref 3.5–5.1)
Sodium: 139 mEq/L (ref 135–145)
Total Bilirubin: 0.4 mg/dL (ref 0.2–1.2)
Total Protein: 7.2 g/dL (ref 6.0–8.3)

## 2021-01-31 LAB — LIPID PANEL
Cholesterol: 218 mg/dL — ABNORMAL HIGH (ref 0–200)
HDL: 68.8 mg/dL (ref >39.00)
LDL Cholesterol: 130 mg/dL — ABNORMAL HIGH (ref 0–99)
NonHDL: 149
Total CHOL/HDL Ratio: 3
Triglycerides: 94 mg/dL (ref 0.0–149.0)
VLDL: 18.8 mg/dL (ref 0.0–40.0)

## 2021-01-31 LAB — HEMOGLOBIN A1C: Hgb A1c MFr Bld: 5.6 % (ref 4.6–6.5)

## 2021-01-31 MED ORDER — VALSARTAN 160 MG PO TABS: 160.0000 mg | ORAL_TABLET | Freq: Every day | ORAL | 3 refills | Status: DC

## 2021-01-31 NOTE — Assessment & Plan Note (Addendum)
Teary during interview.  Attending marriage counseling sessions with hospital. Does not feel she needs medication at this time.  Stress management advice given.  We will continue to monitor.

## 2021-01-31 NOTE — Progress Notes (Signed)
Priscilla Lane 60 y.o.   Chief Complaint  Patient presents with   Hypertension    HISTORY OF PRESENT ILLNESS: This is a 60 y.o. female former patient of Dr. Nolon Rod, here to establish care with me. Has history of hypertension. Seen recently at the urgent care center last May and started on verapamil 40 mg twice a day. In the past patient had reactions to amlodipine and ACE inhibitor with cough and muscle cramps. No angioedema or anaphylactic shock. Presently under increased amount of emotional stress due to marital problems.  Has been seeing marriage counselor. Requesting STD screen due to husband's indiscretions. No other complaints or medical concerns today.  Hypertension Pertinent negatives include no chest pain, headaches, palpitations or shortness of breath.    Prior to Admission medications   Medication Sig Start Date End Date Taking? Authorizing Provider  aspirin 81 MG tablet Take 1 tablet (81 mg total) by mouth daily. 08/14/16  Yes Stallings, Zoe A, MD  hydrochlorothiazide (MICROZIDE) 12.5 MG capsule Take 1 capsule (12.5 mg total) by mouth daily. 12/01/20  Yes Scot Jun, FNP  verapamil (CALAN) 40 MG tablet Take 1 tablet (40 mg total) by mouth 2 (two) times daily. 12/01/20  Yes Scot Jun, FNP    Allergies  Allergen Reactions   Amlodipine     Lower extremity edema   Lisinopril     Cough, muscle cramps   Cephalexin Rash    Taking cephalexin for 2 days and is broken out in a facial rash with swelling of cheeks and eyelids. Incidentally, was also taking some ibuprofen at the same time but she has tolerated that well in the past and it probably is not the allergen.    Patient Active Problem List   Diagnosis Date Noted   Post-menopausal bleeding 11/02/2014   Hypertension, essential    Class 1 obesity due to excess calories with serious comorbidity and body mass index (BMI) of 33.0 to 33.9 in adult     Past Medical History:  Diagnosis Date    Allergic rhinitis    pollen   Hypertension, essential    Obesity     Past Surgical History:  Procedure Laterality Date   ABDOMINOPLASTY  2003   COLONOSCOPY  06/2014   polyp - colon tissue, mod diverticulosis, rpt 10 yrs Henrene Pastor)   saline infusion [place]     for postmenopausal bleeding/fibroids/thickened endometrium Scientist, clinical (histocompatibility and immunogenetics))    Social History   Socioeconomic History   Marital status: Married    Spouse name: Not on file   Number of children: Not on file   Years of education: Not on file   Highest education level: Not on file  Occupational History   Not on file  Tobacco Use   Smoking status: Never   Smokeless tobacco: Never  Vaping Use   Vaping Use: Never used  Substance and Sexual Activity   Alcohol use: No    Alcohol/week: 0.0 standard drinks   Drug use: No   Sexual activity: Never  Other Topics Concern   Not on file  Social History Narrative   Lives with husband, 1 dog   Grown children, 3 grandchildren   Occupation: homemaker, 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   Edu: HS   Activity: 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   Diet: good water, fruits/vegetables daily   Social Determinants of Health   Financial Resource Strain: Not on file  Food Insecurity: Not on file  Transportation Needs: Not on file  Physical Activity: Not on file  Stress: Not on  file  Social Connections: Not on file  Intimate Partner Violence: Not on file    Family History  Problem Relation Age of Onset   Diabetes Mother    CAD Mother 62       CABG   Hypertension Mother    Kidney disease Mother        ESRD on HD   Colon polyps Father    Cancer Maternal Grandmother 72       GYN   Ovarian cancer Maternal Grandmother    Cancer Maternal Grandfather        HEENT   Colon cancer Neg Hx    Rectal cancer Neg Hx    Stomach cancer Neg Hx      Review of Systems  Constitutional: Negative.  Negative for chills and fever.  HENT: Negative.  Negative for congestion and sore throat.   Respiratory: Negative.  Negative  for cough and shortness of breath.   Cardiovascular: Negative.  Negative for chest pain and palpitations.  Gastrointestinal:  Negative for abdominal pain, diarrhea, nausea and vomiting.  Genitourinary: Negative.  Negative for dysuria and hematuria.  Skin: Negative.  Negative for rash.  Neurological: Negative.  Negative for dizziness and headaches.  Psychiatric/Behavioral:  Positive for depression.   All other systems reviewed and are negative.  Today's Vitals   01/31/21 0925  BP: (!) 162/80  Pulse: 91  Temp: 99.2 F (37.3 C)  TempSrc: Oral  SpO2: 98%  Weight: 160 lb (72.6 kg)  Height: 5\' 3"  (1.6 m)   Body mass index is 28.34 kg/m.  Physical Exam Vitals reviewed.  Constitutional:      Appearance: Normal appearance.  HENT:     Head: Normocephalic.  Eyes:     Extraocular Movements: Extraocular movements intact.     Conjunctiva/sclera: Conjunctivae normal.     Pupils: Pupils are equal, round, and reactive to light.  Cardiovascular:     Rate and Rhythm: Normal rate and regular rhythm.     Pulses: Normal pulses.     Heart sounds: Normal heart sounds.  Pulmonary:     Effort: Pulmonary effort is normal.     Breath sounds: Normal breath sounds.  Musculoskeletal:        General: Normal range of motion.     Cervical back: Normal range of motion and neck supple.  Skin:    General: Skin is warm and dry.     Capillary Refill: Capillary refill takes less than 2 seconds.  Neurological:     General: No focal deficit present.     Mental Status: She is alert and oriented to person, place, and time.  Psychiatric:        Mood and Affect: Mood normal.        Behavior: Behavior normal.     ASSESSMENT & PLAN: Hypertension, essential Uncontrolled hypertension.  We will continue hydrochlorothiazide 12.5 mg daily. Stop verapamil and start valsartan 160 mg daily. Advised to continue monitoring daily blood pressure readings at home and keep a log. Dietary approaches to stop  hypertension discussed. Follow-up in 3 months.  Situational mixed anxiety and depressive disorder Teary during interview.  Attending marriage counseling sessions with hospital. Does not feel she needs medication at this time.  Stress management advice given.  We will continue to monitor. Victorious was seen today for hypertension.  Diagnoses and all orders for this visit:  Uncontrolled hypertension  Hypertension, essential -     valsartan (DIOVAN) 160 MG tablet; Take 1 tablet (160 mg total)  by mouth daily. -     Comprehensive metabolic panel -     Hemoglobin A1c -     Lipid panel  Screening for STD (sexually transmitted disease) -     STD Screen (6)  Situational mixed anxiety and depressive disorder  Encounter to establish care  Patient Instructions  Continue hydrochlorothiazide. Stop verapamil. Start valsartan 160 mg daily. Hypertension, Adult High blood pressure (hypertension) is when the force of blood pumping through the arteries is too strong. The arteries are the blood vessels that carry blood from the heart throughout the body. Hypertension forces the heart to work harder to pump blood and may cause arteries to become narrow or stiff. Untreated or uncontrolled hypertension can cause a heart attack, heart failure, a stroke, kidney disease, and otherproblems. A blood pressure reading consists of a higher number over a lower number. Ideally, your blood pressure should be below 120/80. The first ("top") number is called the systolic pressure. It is a measure of the pressure in your arteries as your heart beats. The second ("bottom") number is called the diastolic pressure. It is a measure of the pressure in your arteries as theheart relaxes. What are the causes? The exact cause of this condition is not known. There are some conditions thatresult in or are related to high blood pressure. What increases the risk? Some risk factors for high blood pressure are under your control. The  following factors may make you more likely to develop this condition: Smoking. Having type 2 diabetes mellitus, high cholesterol, or both. Not getting enough exercise or physical activity. Being overweight. Having too much fat, sugar, calories, or salt (sodium) in your diet. Drinking too much alcohol. Some risk factors for high blood pressure may be difficult or impossible to change. Some of these factors include: Having chronic kidney disease. Having a family history of high blood pressure. Age. Risk increases with age. Race. You may be at higher risk if you are African American. Gender. Men are at higher risk than women before age 47. After age 85, women are at higher risk than men. Having obstructive sleep apnea. Stress. What are the signs or symptoms? High blood pressure may not cause symptoms. Very high blood pressure (hypertensive crisis) may cause: Headache. Anxiety. Shortness of breath. Nosebleed. Nausea and vomiting. Vision changes. Severe chest pain. Seizures. How is this diagnosed? This condition is diagnosed by measuring your blood pressure while you are seated, with your arm resting on a flat surface, your legs uncrossed, and your feet flat on the floor. The cuff of the blood pressure monitor will be placed directly against the skin of your upper arm at the level of your heart. It should be measured at least twice using the same arm. Certain conditions cancause a difference in blood pressure between your right and left arms. Certain factors can cause blood pressure readings to be lower or higher than normal for a short period of time: When your blood pressure is higher when you are in a health care provider's office than when you are at home, this is called white coat hypertension. Most people with this condition do not need medicines. When your blood pressure is higher at home than when you are in a health care provider's office, this is called masked hypertension. Most  people with this condition may need medicines to control blood pressure. If you have a high blood pressure reading during one visit or you have normal blood pressure with other risk factors, you may be  asked to: Return on a different day to have your blood pressure checked again. Monitor your blood pressure at home for 1 week or longer. If you are diagnosed with hypertension, you may have other blood or imaging tests to help your health care provider understand your overall risk for otherconditions. How is this treated? This condition is treated by making healthy lifestyle changes, such as eating healthy foods, exercising more, and reducing your alcohol intake. Your health care provider may prescribe medicine if lifestyle changes are not enough to get your blood pressure under control, and if: Your systolic blood pressure is above 130. Your diastolic blood pressure is above 80. Your personal target blood pressure may vary depending on your medicalconditions, your age, and other factors. Follow these instructions at home: Eating and drinking  Eat a diet that is high in fiber and potassium, and low in sodium, added sugar, and fat. An example eating plan is called the DASH (Dietary Approaches to Stop Hypertension) diet. To eat this way: Eat plenty of fresh fruits and vegetables. Try to fill one half of your plate at each meal with fruits and vegetables. Eat whole grains, such as whole-wheat pasta, brown rice, or whole-grain bread. Fill about one fourth of your plate with whole grains. Eat or drink low-fat dairy products, such as skim milk or low-fat yogurt. Avoid fatty cuts of meat, processed or cured meats, and poultry with skin. Fill about one fourth of your plate with lean proteins, such as fish, chicken without skin, beans, eggs, or tofu. Avoid pre-made and processed foods. These tend to be higher in sodium, added sugar, and fat. Reduce your daily sodium intake. Most people with hypertension  should eat less than 1,500 mg of sodium a day. Do not drink alcohol if: Your health care provider tells you not to drink. You are pregnant, may be pregnant, or are planning to become pregnant. If you drink alcohol: Limit how much you use to: 0-1 drink a day for women. 0-2 drinks a day for men. Be aware of how much alcohol is in your drink. In the [place] one drink equals one 12 oz bottle of beer (355 mL), one 5 oz glass of wine (148 mL), or one 1 oz glass of hard liquor (44 mL).  Lifestyle  Work with your health care provider to maintain a healthy body weight or to lose weight. Ask what an ideal weight is for you. Get at least 30 minutes of exercise most days of the week. Activities may include walking, swimming, or biking. Include exercise to strengthen your muscles (resistance exercise), such as Pilates or lifting weights, as part of your weekly exercise routine. Try to do these types of exercises for 30 minutes at least 3 days a week. Do not use any products that contain nicotine or tobacco, such as cigarettes, e-cigarettes, and chewing tobacco. If you need help quitting, ask your health care provider. Monitor your blood pressure at home as told by your health care provider. Keep all follow-up visits as told by your health care provider. This is important.  Medicines Take over-the-counter and prescription medicines only as told by your health care provider. Follow directions carefully. Blood pressure medicines must be taken as prescribed. Do not skip doses of blood pressure medicine. Doing this puts you at risk for problems and can make the medicine less effective. Ask your health care provider about side effects or reactions to medicines that you should watch for. Contact a health care provider if you: Think  you are having a reaction to a medicine you are taking. Have headaches that keep coming back (recurring). Feel dizzy. Have swelling in your ankles. Have trouble with your  vision. Get help right away if you: Develop a severe headache or confusion. Have unusual weakness or numbness. Feel faint. Have severe pain in your chest or abdomen. Vomit repeatedly. Have trouble breathing. Summary Hypertension is when the force of blood pumping through your arteries is too strong. If this condition is not controlled, it may put you at risk for serious complications. Your personal target blood pressure may vary depending on your medical conditions, your age, and other factors. For most people, a normal blood pressure is less than 120/80. Hypertension is treated with lifestyle changes, medicines, or a combination of both. Lifestyle changes include losing weight, eating a healthy, low-sodium diet, exercising more, and limiting alcohol. This information is not intended to replace advice given to you by your health care provider. Make sure you discuss any questions you have with your healthcare provider. Document Revised: 03/10/2018 Document Reviewed: 03/10/2018 Elsevier Patient Education  2022 [place], MD [facility]

## 2021-01-31 NOTE — Patient Instructions (Signed)
Continue hydrochlorothiazide. Stop verapamil. Start valsartan 160 mg daily. Hypertension, Adult High blood pressure (hypertension) is when the force of blood pumping through the arteries is too strong. The arteries are the blood vessels that carry blood from the heart throughout the body. Hypertension forces the heart to work harder to pump blood and may cause arteries to become narrow or stiff. Untreated or uncontrolled hypertension can cause a heart attack, heart failure, a stroke, kidney disease, and otherproblems. A blood pressure reading consists of a higher number over a lower number. Ideally, your blood pressure should be below 120/80. The first ("top") number is called the systolic pressure. It is a measure of the pressure in your arteries as your heart beats. The second ("bottom") number is called the diastolic pressure. It is a measure of the pressure in your arteries as theheart relaxes. What are the causes? The exact cause of this condition is not known. There are some conditions thatresult in or are related to high blood pressure. What increases the risk? Some risk factors for high blood pressure are under your control. The following factors may make you more likely to develop this condition: Smoking. Having type 2 diabetes mellitus, high cholesterol, or both. Not getting enough exercise or physical activity. Being overweight. Having too much fat, sugar, calories, or salt (sodium) in your diet. Drinking too much alcohol. Some risk factors for high blood pressure may be difficult or impossible to change. Some of these factors include: Having chronic kidney disease. Having a family history of high blood pressure. Age. Risk increases with age. Race. You may be at higher risk if you are African American. Gender. Men are at higher risk than women before age 41. After age 93, women are at higher risk than men. Having obstructive sleep apnea. Stress. What are the signs or  symptoms? High blood pressure may not cause symptoms. Very high blood pressure (hypertensive crisis) may cause: Headache. Anxiety. Shortness of breath. Nosebleed. Nausea and vomiting. Vision changes. Severe chest pain. Seizures. How is this diagnosed? This condition is diagnosed by measuring your blood pressure while you are seated, with your arm resting on a flat surface, your legs uncrossed, and your feet flat on the floor. The cuff of the blood pressure monitor will be placed directly against the skin of your upper arm at the level of your heart. It should be measured at least twice using the same arm. Certain conditions cancause a difference in blood pressure between your right and left arms. Certain factors can cause blood pressure readings to be lower or higher than normal for a short period of time: When your blood pressure is higher when you are in a health care provider's office than when you are at home, this is called white coat hypertension. Most people with this condition do not need medicines. When your blood pressure is higher at home than when you are in a health care provider's office, this is called masked hypertension. Most people with this condition may need medicines to control blood pressure. If you have a high blood pressure reading during one visit or you have normal blood pressure with other risk factors, you may be asked to: Return on a different day to have your blood pressure checked again. Monitor your blood pressure at home for 1 week or longer. If you are diagnosed with hypertension, you may have other blood or imaging tests to help your health care provider understand your overall risk for otherconditions. How is this treated? This condition  is treated by making healthy lifestyle changes, such as eating healthy foods, exercising more, and reducing your alcohol intake. Your health care provider may prescribe medicine if lifestyle changes are not enough to get your  blood pressure under control, and if: Your systolic blood pressure is above 130. Your diastolic blood pressure is above 80. Your personal target blood pressure may vary depending on your medicalconditions, your age, and other factors. Follow these instructions at home: Eating and drinking  Eat a diet that is high in fiber and potassium, and low in sodium, added sugar, and fat. An example eating plan is called the DASH (Dietary Approaches to Stop Hypertension) diet. To eat this way: Eat plenty of fresh fruits and vegetables. Try to fill one half of your plate at each meal with fruits and vegetables. Eat whole grains, such as whole-wheat pasta, brown rice, or whole-grain bread. Fill about one fourth of your plate with whole grains. Eat or drink low-fat dairy products, such as skim milk or low-fat yogurt. Avoid fatty cuts of meat, processed or cured meats, and poultry with skin. Fill about one fourth of your plate with lean proteins, such as fish, chicken without skin, beans, eggs, or tofu. Avoid pre-made and processed foods. These tend to be higher in sodium, added sugar, and fat. Reduce your daily sodium intake. Most people with hypertension should eat less than 1,500 mg of sodium a day. Do not drink alcohol if: Your health care provider tells you not to drink. You are pregnant, may be pregnant, or are planning to become pregnant. If you drink alcohol: Limit how much you use to: 0-1 drink a day for women. 0-2 drinks a day for men. Be aware of how much alcohol is in your drink. In the [place] one drink equals one 12 oz bottle of beer (355 mL), one 5 oz glass of wine (148 mL), or one 1 oz glass of hard liquor (44 mL).  Lifestyle  Work with your health care provider to maintain a healthy body weight or to lose weight. Ask what an ideal weight is for you. Get at least 30 minutes of exercise most days of the week. Activities may include walking, swimming, or biking. Include exercise to strengthen  your muscles (resistance exercise), such as Pilates or lifting weights, as part of your weekly exercise routine. Try to do these types of exercises for 30 minutes at least 3 days a week. Do not use any products that contain nicotine or tobacco, such as cigarettes, e-cigarettes, and chewing tobacco. If you need help quitting, ask your health care provider. Monitor your blood pressure at home as told by your health care provider. Keep all follow-up visits as told by your health care provider. This is important.  Medicines Take over-the-counter and prescription medicines only as told by your health care provider. Follow directions carefully. Blood pressure medicines must be taken as prescribed. Do not skip doses of blood pressure medicine. Doing this puts you at risk for problems and can make the medicine less effective. Ask your health care provider about side effects or reactions to medicines that you should watch for. Contact a health care provider if you: Think you are having a reaction to a medicine you are taking. Have headaches that keep coming back (recurring). Feel dizzy. Have swelling in your ankles. Have trouble with your vision. Get help right away if you: Develop a severe headache or confusion. Have unusual weakness or numbness. Feel faint. Have severe pain in your chest or  abdomen. Vomit repeatedly. Have trouble breathing. Summary Hypertension is when the force of blood pumping through your arteries is too strong. If this condition is not controlled, it may put you at risk for serious complications. Your personal target blood pressure may vary depending on your medical conditions, your age, and other factors. For most people, a normal blood pressure is less than 120/80. Hypertension is treated with lifestyle changes, medicines, or a combination of both. Lifestyle changes include losing weight, eating a healthy, low-sodium diet, exercising more, and limiting alcohol. This  information is not intended to replace advice given to you by your health care provider. Make sure you discuss any questions you have with your healthcare provider. Document Revised: 03/10/2018 Document Reviewed: 03/10/2018 Elsevier Patient Education  [place].

## 2021-01-31 NOTE — Addendum Note (Signed)
Addended by: Davina Poke on: 01/31/2021 10:32 AM   Modules accepted: Orders

## 2021-01-31 NOTE — Assessment & Plan Note (Signed)
Uncontrolled hypertension.  We will continue hydrochlorothiazide 12.5 mg daily. Stop verapamil and start valsartan 160 mg daily. Advised to continue monitoring daily blood pressure readings at home and keep a log. Dietary approaches to stop hypertension discussed. Follow-up in 3 months.

## 2021-02-01 LAB — HEPATITIS C ANTIBODY
Hepatitis C Ab: NONREACTIVE
SIGNAL TO CUT-OFF: 0.01 (ref <1.00)

## 2021-02-01 LAB — RPR QUALITATIVE: RPR Ser Ql: NONREACTIVE

## 2021-02-01 LAB — HEPATITIS B SURFACE ANTIGEN: Hepatitis B Surface Ag: NONREACTIVE

## 2021-02-01 LAB — HEPATITIS B SURFACE ANTIBODY, QUANTITATIVE: Hep B S AB Quant (Post): <5 m[IU]/mL — ABNORMAL LOW (ref >=10)

## 2021-02-01 LAB — HIV ANTIBODY (ROUTINE TESTING W REFLEX): HIV 1&2 Ab, 4th Generation: NONREACTIVE

## 2021-02-04 ENCOUNTER — Telehealth: Payer: Self-pay

## 2021-02-05 NOTE — Telephone Encounter (Signed)
Called and spoke with pt about lab results.

## 2021-02-14 ENCOUNTER — Ambulatory Visit
Admission: RE | Admit: 2021-02-14 | Discharge: 2021-02-14 | Disposition: A | Discharge status: Home / Self Care | Payer: Self-pay | Source: Ambulatory Visit | Attending: Obstetrics and Gynecology | Admitting: Obstetrics and Gynecology

## 2021-02-14 ENCOUNTER — Ambulatory Visit: Payer: Self-pay | Admitting: *Deleted

## 2021-02-14 ENCOUNTER — Other Ambulatory Visit: Payer: Self-pay

## 2021-02-14 VITALS — BP 146/88 | Wt 161.8 lb

## 2021-02-14 DIAGNOSIS — Z1231 Encounter for screening mammogram for malignant neoplasm of breast: Secondary | ICD-10-CM

## 2021-02-14 DIAGNOSIS — Z1239 Encounter for other screening for malignant neoplasm of breast: Secondary | ICD-10-CM

## 2021-02-14 NOTE — Progress Notes (Signed)
[name] is a 60 y.o. female who presents to [facility] clinic today with no complaints.    Pap Smear: Pap smear not completed today. Last Pap smear was 01/04/2019 at [place] [facility] clinic and was normal. Per patient has no history of an abnormal Pap smear. Last Pap smear result is available in Epic.   Physical exam: Breasts Breasts symmetrical. No skin abnormalities bilateral breasts. No nipple retraction bilateral breasts. No nipple discharge bilateral breasts. No lymphadenopathy. No lumps palpated bilateral breasts. Fatty tissue bilateral axilla that per patient states has always been there. No complaints of pain or tenderness on exam.     Pelvic/Bimanual Pap is not indicated today per [facility] guidelines.   Smoking History: Patient has never smoked.   Patient Navigation: Patient education provided. Access to services provided for patient through [facility] program.   Colorectal Cancer Screening: Per patient has had colonoscopy completed on 07/03/2014 at [place].  No complaints today.    Breast and Cervical Cancer Risk Assessment: Patient does not have family history of breast cancer, known genetic mutations, or radiation treatment to the chest before age 89. Patient does not have history of cervical dysplasia, immunocompromised, or DES exposure in-utero.  Risk Assessment     Risk Scores       02/14/2021   Last edited by: Priscilla Revel, LPN   5-year risk: 1.1 %   Lifetime risk: 5.2 %            A: [facility] exam without pap smear No complaints.  P: Referred patient to the [facility] for a screening mammogram on the mobile unit. Appointment scheduled Thursday, February 14, 2021 at 1410.  Loletta Parish, RN 02/14/2021 1:24 PM

## 2021-02-14 NOTE — Patient Instructions (Signed)
Explained breast self awareness with [name]. Patient did not need a Pap smear today due to last Pap smear was 01/04/2019. Let her know [facility] will cover Pap smears every 3 years unless has a history of abnormal Pap smears. Referred patient to the [facility] for a screening mammogram on the mobile unit. Appointment scheduled Thursday, February 14, 2021 at 1410. Patient escorted to the mobile unit following [facility] appointment for her screening mammogram. Let [facility] know [facility] will [facility] with her within the next couple weeks with results of her mammogram by letter or phone. Priscilla Lane verbalized understanding.  [name], Arvil Chaco, RN 1:24 PM

## 2021-03-13 ENCOUNTER — Other Ambulatory Visit: Payer: Self-pay | Admitting: Family Medicine

## 2021-05-07 ENCOUNTER — Encounter: Payer: Self-pay | Admitting: Emergency Medicine

## 2021-05-07 ENCOUNTER — Ambulatory Visit (INDEPENDENT_AMBULATORY_CARE_PROVIDER_SITE_OTHER): Payer: Self-pay | Admitting: Emergency Medicine

## 2021-05-07 ENCOUNTER — Other Ambulatory Visit: Payer: Self-pay

## 2021-05-07 VITALS — BP 140/76 | HR 95 | Temp 98.3°F | Ht 63.0 in | Wt 168.0 lb

## 2021-05-07 DIAGNOSIS — I10 Essential (primary) hypertension: Secondary | ICD-10-CM

## 2021-05-07 DIAGNOSIS — E785 Hyperlipidemia, unspecified: Secondary | ICD-10-CM | POA: Insufficient documentation

## 2021-05-07 MED ORDER — VALSARTAN-HYDROCHLOROTHIAZIDE 160-12.5 MG PO TABS
1.0000 | ORAL_TABLET | Freq: Every day | ORAL | 3 refills | Status: DC
Start: 2021-05-07 — End: 2021-05-14

## 2021-05-07 MED ORDER — ROSUVASTATIN CALCIUM 10 MG PO TABS: 10.0000 mg | ORAL_TABLET | Freq: Every day | ORAL | 3 refills | Status: DC

## 2021-05-07 NOTE — Assessment & Plan Note (Signed)
Blood pressure still not at goal.  Has been taking only valsartan 160 mg daily. Will start valsartan-HCTZ 160-12.5 mg daily.  Advised to continue monitoring blood pressure readings at home daily for the next several weeks. Dietary approaches to stop hypertension discussed. Follow-up in 3 months.

## 2021-05-07 NOTE — Assessment & Plan Note (Signed)
Diet and nutrition discussed.  Abnormal lipid profile from July 2022. The 10-year ASCVD risk score (Arnett DK, et al., 2019) is: 8.9%   Values used to calculate the score:     Age: 60 years     Sex: Female     Is Non-Hispanic African American: Yes     Diabetic: No     Tobacco smoker: No     Systolic Blood Pressure: 859 mmHg     Is BP treated: Yes     HDL Cholesterol: 68.8 mg/dL     Total Cholesterol: 218 mg/dL We will start rosuvastatin 10 mg daily.

## 2021-05-07 NOTE — Progress Notes (Signed)
Priscilla Lane 60 y.o.   Chief Complaint  Patient presents with   Hypertension    3 month F/u. Med refill on BP meds.    HISTORY OF PRESENT ILLNESS: This is a 60 y.o. female with history of hypertension on valsartan 160 mg daily 51-month follow-up and medication refill. Not taking hydrochlorothiazide.  Blood pressure readings at home similar to the ones in the office here today. Blood work done last July showed dyslipidemia. Doing well.  Has no complaints or medical concerns today. BP Readings from Last 3 Encounters:  05/07/21 140/76  02/14/21 (!) 146/88  01/31/21 (!) 162/80   The 10-year ASCVD risk score (Arnett DK, et al., 2019) is: 8.9%   Values used to calculate the score:     Age: 58 years     Sex: Female     Is Non-Hispanic African American: Yes     Diabetic: No     Tobacco smoker: No     Systolic Blood Pressure: 530 mmHg     Is BP treated: Yes     HDL Cholesterol: 68.8 mg/dL     Total Cholesterol: 218 mg/dL   Hypertension Pertinent negatives include no chest pain, headaches, palpitations or shortness of breath.    Prior to Admission medications   Medication Sig Start Date End Date Taking? Authorizing Provider  aspirin 81 MG tablet Take 1 tablet (81 mg total) by mouth daily. 08/14/16  Yes Stallings, Zoe A, MD  hydrochlorothiazide (MICROZIDE) 12.5 MG capsule Take 1 capsule (12.5 mg total) by mouth daily. 12/01/20  Yes Scot Jun, FNP  valsartan (DIOVAN) 160 MG tablet Take 1 tablet (160 mg total) by mouth daily. 01/31/21  Yes SagardiaInes Bloomer, MD    Allergies  Allergen Reactions   Amlodipine     Lower extremity edema   Lisinopril     Cough, muscle cramps   Cephalexin Rash    Taking cephalexin for 2 days and is broken out in a facial rash with swelling of cheeks and eyelids. Incidentally, was also taking some ibuprofen at the same time but she has tolerated that well in the past and it probably is not the allergen.    Patient Active Problem List    Diagnosis Date Noted   Situational mixed anxiety and depressive disorder 01/31/2021   Closed nondisplaced fracture of distal phalanx of left index finger with routine healing 11/05/2015   Laceration of left index finger w/o foreign body with damage to nail 10/31/2015   Post-menopausal bleeding 11/02/2014   Hypertension, essential    Class 1 obesity due to excess calories with serious comorbidity and body mass index (BMI) of 33.0 to 33.9 in adult     Past Medical History:  Diagnosis Date   Allergic rhinitis    pollen   Hypertension, essential    Obesity     Past Surgical History:  Procedure Laterality Date   ABDOMINOPLASTY  2003   COLONOSCOPY  06/2014   polyp - colon tissue, mod diverticulosis, rpt 10 yrs Henrene Pastor)   saline infusion [place]     for postmenopausal bleeding/fibroids/thickened endometrium Scientist, clinical (histocompatibility and immunogenetics))    Social History   Socioeconomic History   Marital status: Married    Spouse name: Not on file   Number of children: 3   Years of education: Not on file   Highest education level: High school graduate  Occupational History   Not on file  Tobacco Use   Smoking status: Never   Smokeless tobacco: Never  Vaping Use  Vaping Use: Never used  Substance and Sexual Activity   Alcohol use: No    Alcohol/week: 0.0 standard drinks   Drug use: No   Sexual activity: Not Currently  Other Topics Concern   Not on file  Social History Narrative   Lives with husband, 1 dog   Grown children, 3 grandchildren   Occupation: homemaker, 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   Edu: HS   Activity: 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   Diet: good water, fruits/vegetables daily   Social Determinants of Health   Financial Resource Strain: Not on file  Food Insecurity: No Food Insecurity   Worried About Charity fundraiser in the Last Year: Never true   [place] in the Last Year: Never true  Transportation Needs: No Transportation Needs   Lack of Transportation (Medical): No   Lack of Transportation  (Non-Medical): No  Physical Activity: Not on file  Stress: Not on file  Social Connections: Not on file  Intimate Partner Violence: Not on file    Family History  Problem Relation Age of Onset   Diabetes Mother    CAD Mother 46       CABG   Hypertension Mother    Kidney disease Mother        ESRD on HD   Colon polyps Father    Cancer Maternal Grandmother 56       GYN   Ovarian cancer Maternal Grandmother    Cancer Maternal Grandfather        HEENT   Colon cancer Neg Hx    Rectal cancer Neg Hx    Stomach cancer Neg Hx      Review of Systems  Constitutional: Negative.  Negative for chills and fever.  HENT: Negative.  Negative for congestion and sore throat.   Respiratory: Negative.  Negative for cough and shortness of breath.   Cardiovascular: Negative.  Negative for chest pain and palpitations.  Gastrointestinal: Negative.  Negative for abdominal pain, diarrhea, nausea and vomiting.  Genitourinary: Negative.   Musculoskeletal:        Chronic pain to right Achilles tendon  Skin: Negative.   Neurological: Negative.  Negative for dizziness and headaches.  All other systems reviewed and are negative.  Today's Vitals   05/07/21 0928  BP: 140/76  Pulse: 95  Temp: 98.3 F (36.8 C)  TempSrc: Oral  SpO2: 96%  Weight: 168 lb (76.2 kg)  Height: 5\' 3"  (1.6 m)   Body mass index is 29.76 kg/m.  Physical Exam Vitals reviewed.  Constitutional:      Appearance: Normal appearance.  HENT:     Head: Normocephalic.  Eyes:     Extraocular Movements: Extraocular movements intact.     Pupils: Pupils are equal, round, and reactive to light.  Cardiovascular:     Rate and Rhythm: Normal rate and regular rhythm.     Pulses: Normal pulses.     Heart sounds: Normal heart sounds.  Pulmonary:     Effort: Pulmonary effort is normal.     Breath sounds: Normal breath sounds.  Musculoskeletal:     Cervical back: Normal range of motion and neck supple.     Comments: Tenderness to  right Achilles tendon  Skin:    General: Skin is warm and dry.     Capillary Refill: Capillary refill takes less than 2 seconds.  Neurological:     General: No focal deficit present.     Mental Status: She is alert and oriented to person, place, and  time.  Psychiatric:        Mood and Affect: Mood normal.        Behavior: Behavior normal.     ASSESSMENT & PLAN: Problem List Items Addressed This Visit       Cardiovascular and Mediastinum   Hypertension, essential - Primary    Blood pressure still not at goal.  Has been taking only valsartan 160 mg daily. Will start valsartan-HCTZ 160-12.5 mg daily.  Advised to continue monitoring blood pressure readings at home daily for the next several weeks. Dietary approaches to stop hypertension discussed. Follow-up in 3 months.      Relevant Medications   valsartan-hydrochlorothiazide (DIOVAN-HCT) 160-12.5 MG tablet   rosuvastatin (CRESTOR) 10 MG tablet     Other   Dyslipidemia    Diet and nutrition discussed.  Abnormal lipid profile from July 2022. The 10-year ASCVD risk score (Arnett DK, et al., 2019) is: 8.9%   Values used to calculate the score:     Age: 77 years     Sex: Female     Is Non-Hispanic African American: Yes     Diabetic: No     Tobacco smoker: No     Systolic Blood Pressure: 323 mmHg     Is BP treated: Yes     HDL Cholesterol: 68.8 mg/dL     Total Cholesterol: 218 mg/dL We will start rosuvastatin 10 mg daily.      Relevant Medications   rosuvastatin (CRESTOR) 10 MG tablet   Patient Instructions  Hypertension, Adult High blood pressure (hypertension) is when the force of blood pumping through the arteries is too strong. The arteries are the blood vessels that carry blood from the heart throughout the body. Hypertension forces the heart to work harder to pump blood and may cause arteries to become narrow or stiff. Untreated or uncontrolled hypertension can cause a heart attack, heart failure, a stroke, kidney  disease, and other problems. A blood pressure reading consists of a higher number over a lower number. Ideally, your blood pressure should be below 120/80. The first ("top") number is called the systolic pressure. It is a measure of the pressure in your arteries as your heart beats. The second ("bottom") number is called the diastolic pressure. It is a measure of the pressure in your arteries as the heart relaxes. What are the causes? The exact cause of this condition is not known. There are some conditions that result in or are related to high blood pressure. What increases the risk? Some risk factors for high blood pressure are under your control. The following factors may make you more likely to develop this condition: Smoking. Having type 2 diabetes mellitus, high cholesterol, or both. Not getting enough exercise or physical activity. Being overweight. Having too much fat, sugar, calories, or salt (sodium) in your diet. Drinking too much alcohol. Some risk factors for high blood pressure may be difficult or impossible to change. Some of these factors include: Having chronic kidney disease. Having a family history of high blood pressure. Age. Risk increases with age. Race. You may be at higher risk if you are African American. Gender. Men are at higher risk than women before age 80. After age 56, women are at higher risk than men. Having obstructive sleep apnea. Stress. What are the signs or symptoms? High blood pressure may not cause symptoms. Very high blood pressure (hypertensive crisis) may cause: Headache. Anxiety. Shortness of breath. Nosebleed. Nausea and vomiting. Vision changes. Severe chest pain. Seizures. How is  this diagnosed? This condition is diagnosed by measuring your blood pressure while you are seated, with your arm resting on a flat surface, your legs uncrossed, and your feet flat on the floor. The cuff of the blood pressure monitor will be placed directly against  the skin of your upper arm at the level of your heart. It should be measured at least twice using the same arm. Certain conditions can cause a difference in blood pressure between your right and left arms. Certain factors can cause blood pressure readings to be lower or higher than normal for a short period of time: When your blood pressure is higher when you are in a health care provider's office than when you are at home, this is called white coat hypertension. Most people with this condition do not need medicines. When your blood pressure is higher at home than when you are in a health care provider's office, this is called masked hypertension. Most people with this condition may need medicines to control blood pressure. If you have a high blood pressure reading during one visit or you have normal blood pressure with other risk factors, you may be asked to: Return on a different day to have your blood pressure checked again. Monitor your blood pressure at home for 1 week or longer. If you are diagnosed with hypertension, you may have other blood or imaging tests to help your health care provider understand your overall risk for other conditions. How is this treated? This condition is treated by making healthy lifestyle changes, such as eating healthy foods, exercising more, and reducing your alcohol intake. Your health care provider may prescribe medicine if lifestyle changes are not enough to get your blood pressure under control, and if: Your systolic blood pressure is above 130. Your diastolic blood pressure is above 80. Your personal target blood pressure may vary depending on your medical conditions, your age, and other factors. Follow these instructions at home: Eating and drinking  Eat a diet that is high in fiber and potassium, and low in sodium, added sugar, and fat. An example eating plan is called the DASH (Dietary Approaches to Stop Hypertension) diet. To eat this way: Eat plenty of  fresh fruits and vegetables. Try to fill one half of your plate at each meal with fruits and vegetables. Eat whole grains, such as whole-wheat pasta, brown rice, or whole-grain bread. Fill about one fourth of your plate with whole grains. Eat or drink low-fat dairy products, such as skim milk or low-fat yogurt. Avoid fatty cuts of meat, processed or cured meats, and poultry with skin. Fill about one fourth of your plate with lean proteins, such as fish, chicken without skin, beans, eggs, or tofu. Avoid pre-made and processed foods. These tend to be higher in sodium, added sugar, and fat. Reduce your daily sodium intake. Most people with hypertension should eat less than 1,500 mg of sodium a day. Do not drink alcohol if: Your health care provider tells you not to drink. You are pregnant, may be pregnant, or are planning to become pregnant. If you drink alcohol: Limit how much you use to: 0-1 drink a day for women. 0-2 drinks a day for men. Be aware of how much alcohol is in your drink. In the [place] one drink equals one 12 oz bottle of beer (355 mL), one 5 oz glass of wine (148 mL), or one 1 oz glass of hard liquor (44 mL). Lifestyle  Work with your health care provider to maintain  a healthy body weight or to lose weight. Ask what an ideal weight is for you. Get at least 30 minutes of exercise most days of the week. Activities may include walking, swimming, or biking. Include exercise to strengthen your muscles (resistance exercise), such as Pilates or lifting weights, as part of your weekly exercise routine. Try to do these types of exercises for 30 minutes at least 3 days a week. Do not use any products that contain nicotine or tobacco, such as cigarettes, e-cigarettes, and chewing tobacco. If you need help quitting, ask your health care provider. Monitor your blood pressure at home as told by your health care provider. Keep all follow-up visits as told by your health care provider. This is  important. Medicines Take over-the-counter and prescription medicines only as told by your health care provider. Follow directions carefully. Blood pressure medicines must be taken as prescribed. Do not skip doses of blood pressure medicine. Doing this puts you at risk for problems and can make the medicine less effective. Ask your health care provider about side effects or reactions to medicines that you should watch for. Contact a health care provider if you: Think you are having a reaction to a medicine you are taking. Have headaches that keep coming back (recurring). Feel dizzy. Have swelling in your ankles. Have trouble with your vision. Get help right away if you: Develop a severe headache or confusion. Have unusual weakness or numbness. Feel faint. Have severe pain in your chest or abdomen. Vomit repeatedly. Have trouble breathing. Summary Hypertension is when the force of blood pumping through your arteries is too strong. If this condition is not controlled, it may put you at risk for serious complications. Your personal target blood pressure may vary depending on your medical conditions, your age, and other factors. For most people, a normal blood pressure is less than 120/80. Hypertension is treated with lifestyle changes, medicines, or a combination of both. Lifestyle changes include losing weight, eating a healthy, low-sodium diet, exercising more, and limiting alcohol. This information is not intended to replace advice given to you by your health care provider. Make sure you discuss any questions you have with your health care provider. Document Revised: 03/10/2018 Document Reviewed: 03/10/2018 Elsevier Patient Education  2022 [place], [place] [facility]

## 2021-05-07 NOTE — Patient Instructions (Signed)

## 2021-05-08 ENCOUNTER — Ambulatory Visit
Admission: EM | Admit: 2021-05-08 | Discharge: 2021-05-08 | Disposition: A | Discharge status: Home / Self Care | Payer: No Typology Code available for payment source | Attending: Internal Medicine | Admitting: Internal Medicine

## 2021-05-08 ENCOUNTER — Other Ambulatory Visit: Payer: Self-pay

## 2021-05-08 ENCOUNTER — Encounter: Payer: Self-pay | Admitting: Emergency Medicine

## 2021-05-08 DIAGNOSIS — M25571 Pain in right ankle and joints of right foot: Secondary | ICD-10-CM

## 2021-05-08 DIAGNOSIS — M7660 Achilles tendinitis, unspecified leg: Secondary | ICD-10-CM

## 2021-05-08 DIAGNOSIS — M7661 Achilles tendinitis, right leg: Secondary | ICD-10-CM

## 2021-05-08 MED ORDER — IBUPROFEN 600 MG PO TABS: 600.0000 mg | ORAL_TABLET | Freq: Four times a day (QID) | ORAL | 0 refills | Status: DC | PRN

## 2021-05-08 NOTE — ED Triage Notes (Signed)
Pain in Achilles area of foot.  Patient had tenderness in this area for 2 days.  Today, she cannot walk on right foot.  Recently was in [place] and wearing wedge heals during the trip and normally wears tennis shoes.  PCP thought this may have aggravated this area.  Pain has worsened since seeing him

## 2021-05-08 NOTE — Discharge Instructions (Addendum)
Please follow-up with provided contact information for podiatry for further evaluation and management.  Alternate ice and heat to affected area of pain.  Also elevate extremity.  You have been prescribed ibuprofen to take to decrease pain and inflammation.  Please limit use.  Also monitor blood pressures more often while on ibuprofen as ibuprofen may cause an increase in your blood pressure if you have chronic high blood pressure.

## 2021-05-08 NOTE — ED Provider Notes (Signed)
[facility]    CSN: [id number] Arrival date & time: 05/08/21  0805      History   Chief Complaint Chief Complaint  Patient presents with   Ankle Pain    HPI Priscilla Lane is a 60 y.o. female.   Patient presents with 2-day history of right posterior ankle pain.  Patient denies any apparent injury but does state that she was recently in [place] where she wore a shoe with a heel on it as opposed to her daily tennis shoes and is attributing the pain to this.  Patient denies that this pain has ever occurred before.  Pain is aggravated with weightbearing.  Denies any numbness or tingling.  Has difficulty bearing weight.  Has taken 1 dose of ibuprofen yesterday with some improvement.  Has also used heat application.  Saw PCP yesterday and was advised by PCP to follow-up if pain persists.  Although, patient reports that pain has worsened since yesterday and would like evaluation.   Ankle Pain  Past Medical History:  Diagnosis Date   Allergic rhinitis    pollen   Hypertension, essential    Obesity     Patient Active Problem List   Diagnosis Date Noted   Dyslipidemia 05/07/2021   Situational mixed anxiety and depressive disorder 01/31/2021   Hypertension, essential    Class 1 obesity due to excess calories with serious comorbidity and body mass index (BMI) of 33.0 to 33.9 in adult     Past Surgical History:  Procedure Laterality Date   ABDOMINOPLASTY  2003   COLONOSCOPY  06/2014   polyp - colon tissue, mod diverticulosis, rpt 10 yrs ([place])   saline infusion [place]     for postmenopausal bleeding/fibroids/thickened endometrium ([place])    OB History   No obstetric history on file.      Home Medications    Prior to Admission medications   Medication Sig Start Date End Date Taking? Authorizing Provider  ibuprofen (ADVIL) 600 MG tablet Take 1 tablet (600 mg total) by mouth every 6 (six) hours as needed for mild pain. 05/08/21  Yes Oswaldo Conroy E, FNP   aspirin 81 MG tablet Take 1 tablet (81 mg total) by mouth daily. 08/14/16   Forrest Moron, MD  rosuvastatin (CRESTOR) 10 MG tablet Take 1 tablet (10 mg total) by mouth daily. 05/07/21   Horald Pollen, MD  valsartan-hydrochlorothiazide (DIOVAN-HCT) 160-12.5 MG tablet Take 1 tablet by mouth daily. 05/07/21   Horald Pollen, MD    Family History Family History  Problem Relation Age of Onset   Diabetes Mother    CAD Mother 10       CABG   Hypertension Mother    Kidney disease Mother        ESRD on HD   Colon polyps Father    Cancer Maternal Grandmother 60       GYN   Ovarian cancer Maternal Grandmother    Cancer Maternal Grandfather        HEENT   Colon cancer Neg Hx    Rectal cancer Neg Hx    Stomach cancer Neg Hx     Social History Social History   Tobacco Use   Smoking status: Never   Smokeless tobacco: Never  Vaping Use   Vaping Use: Never used  Substance Use Topics   Alcohol use: No    Alcohol/week: 0.0 standard drinks   Drug use: No     Allergies   Amlodipine, Lisinopril, and Cephalexin  Review of Systems Review of Systems per HPI  Physical Exam Triage Vital Signs ED Triage Vitals  Enc Vitals Group     BP 05/08/21 0830 (!) 153/83     Pulse Rate 05/08/21 0830 69     Resp 05/08/21 0830 20     Temp 05/08/21 0830 98 F (36.7 C)     Temp Source 05/08/21 0830 Oral     SpO2 05/08/21 0830 96 %     Weight --      Height --      Head Circumference --      Peak Flow --      Pain Score 05/08/21 0842 8     Pain Loc --      Pain Edu? --      Excl. in [place]? --    No data found.  Updated Vital Signs BP (!) 153/83 (BP Location: Left Arm)   Pulse 69   Temp 98 F (36.7 C) (Oral)   Resp 20   SpO2 96%   Visual Acuity Right Eye Distance:   Left Eye Distance:   Bilateral Distance:    Right Eye Near:   Left Eye Near:    Bilateral Near:     Physical Exam Constitutional:      General: She is not in acute distress.    Appearance:  Normal appearance. She is not toxic-appearing or diaphoretic.  HENT:     Head: Normocephalic and atraumatic.  Eyes:     Extraocular Movements: Extraocular movements intact.     Conjunctiva/sclera: Conjunctivae normal.  Pulmonary:     Effort: Pulmonary effort is normal.  Musculoskeletal:     Right ankle: No swelling, deformity or ecchymosis. Tenderness present. Decreased range of motion. Anterior drawer test negative. Normal pulse.     Right Achilles Tendon: Tenderness present.       Legs:     Comments: Tenderness to palpation to circled area on diagram.  No erythema, lacerations, abrasions, bruising noted.  Neurovascular intact.  Neurological:     General: No focal deficit present.     Mental Status: She is alert and oriented to person, place, and time. Mental status is at baseline.  Psychiatric:        Mood and Affect: Mood normal.        Behavior: Behavior normal.        Thought Content: Thought content normal.        Judgment: Judgment normal.     UC Treatments / Results  Labs (all labs ordered are listed, but only abnormal results are displayed) Labs Reviewed - No data to display  EKG   Radiology No results found.  Procedures Procedures (including critical care time)  Medications Ordered in UC Medications - No data to display  Initial Impression / Assessment and Plan / UC Course  I have reviewed the triage vital signs and the nursing notes.  Pertinent labs & imaging results that were available during my care of the patient were reviewed by me and considered in my medical decision making (see chart for details).     Discussed with patient that I do not think that x-ray imaging is necessary given location of pain on exam and this is most likely related to muscle, ligament, tendon injury.  Lane prescribe ibuprofen as needed for pain and inflammation.  Discussed limiting this medication given patient's age, although recent CMP was normal.  Advised patient to monitor  blood pressures closely while taking medication.  Alternate ice and  heat application.  Advised patient to use nonweightbearing strategies including crutches until evaluated by podiatry.  Patient was offered crutches but declined stating that she had crutches at home.  Patient Lane need to be further evaluated and managed by podiatry as soon as possible.  Provided patient with contact information for podiatry.  No red flags on exam.Discussed strict return precautions. Patient verbalized understanding and is agreeable with plan.  Final Clinical Impressions(s) / UC Diagnoses   Final diagnoses:  Acute right ankle pain  Pain in Achilles tendon     Discharge Instructions      Please follow-up with provided contact information for podiatry for further evaluation and management.  Alternate ice and heat to affected area of pain.  Also elevate extremity.  You have been prescribed ibuprofen to take to decrease pain and inflammation.  Please limit use.  Also monitor blood pressures more often while on ibuprofen as ibuprofen may cause an increase in your blood pressure if you have chronic high blood pressure.     ED Prescriptions     Medication Sig Dispense Auth. Provider   ibuprofen (ADVIL) 600 MG tablet Take 1 tablet (600 mg total) by mouth every 6 (six) hours as needed for mild pain. 30 tablet [place]el, Michele Rockers, [place]      PDMP not reviewed this encounter.   Teodora Medici, [place] 05/08/21 [phone]

## 2021-05-10 ENCOUNTER — Telehealth: Payer: Self-pay | Admitting: Emergency Medicine

## 2021-05-10 NOTE — Telephone Encounter (Signed)
Patient is requesting an alternative for . States the cost of the medication is over $200 which she is unable to afford at this time. Stated she has been without the since the 25th.   Please advise

## 2021-05-13 NOTE — Telephone Encounter (Signed)
Pt. Is requesting generic brand of valsartan-hydrochlorothiazide (DIOVAN-HCT) 160-12.5 MG tablet. States that medication is too expensive.   Please advise.    Callback #- [phone]

## 2021-05-13 NOTE — Telephone Encounter (Signed)
Okay to send generic brand.  Thanks.

## 2021-05-14 MED ORDER — HYDROCHLOROTHIAZIDE 12.5 MG PO CAPS: 12.5000 mg | ORAL_CAPSULE | Freq: Every day | ORAL | 1 refills | Status: DC

## 2021-05-14 MED ORDER — VALSARTAN 160 MG PO TABS: 160.0000 mg | ORAL_TABLET | Freq: Every day | ORAL | 1 refills | Status: DC

## 2021-05-14 NOTE — Telephone Encounter (Signed)
Patient calling in to see if generic rx had been sent to pharmacy  Advised patient it is currently being worked on  Please provide patient w/ update as soon as possible [phone]

## 2021-05-14 NOTE — Telephone Encounter (Signed)
If BP is well controlled on this regimen I would recommend splitting the two medications up and writing for valsartan and HCTZ separately, alternatively there is a lisinopril-hctz - the equivalent dose to this would be lisinopril 20mg  hctz- 12.5mg  daily   Let me know if there is anything else I can do to help  Linna Hoff

## 2021-05-14 NOTE — Telephone Encounter (Signed)
Patient states that the combination medication valsartan-hydrochlorothiazide ( Diovan-hct) is too expensive and would like something else. Split the medication and reordered it to [facility]. Called and made pt aware.

## 2021-05-15 ENCOUNTER — Other Ambulatory Visit: Payer: Self-pay

## 2021-05-15 ENCOUNTER — Ambulatory Visit (INDEPENDENT_AMBULATORY_CARE_PROVIDER_SITE_OTHER): Payer: Self-pay

## 2021-05-15 ENCOUNTER — Ambulatory Visit (INDEPENDENT_AMBULATORY_CARE_PROVIDER_SITE_OTHER): Payer: Self-pay | Admitting: Podiatry

## 2021-05-15 DIAGNOSIS — M7750 Other enthesopathy of unspecified foot: Secondary | ICD-10-CM

## 2021-05-15 DIAGNOSIS — M7661 Achilles tendinitis, right leg: Secondary | ICD-10-CM

## 2021-05-15 MED ORDER — MELOXICAM 15 MG PO TABS: 15.0000 mg | ORAL_TABLET | Freq: Every day | ORAL | 1 refills | Status: DC

## 2021-05-15 MED ORDER — BETAMETHASONE SOD PHOS & ACET 6 (3-3) MG/ML IJ SUSP
3.0000 mg | Freq: Once | INTRAMUSCULAR | Status: AC
  Administered 2021-05-15: 3 mg via INTRA_ARTICULAR

## 2021-05-15 MED ORDER — METHYLPREDNISOLONE 4 MG PO TBPK: ORAL_TABLET | ORAL | 0 refills | Status: DC

## 2021-05-15 NOTE — Progress Notes (Signed)
   HPI: 60 y.o. female presenting today for evaluation of an acute flareup of pain and tenderness to the posterior aspect of the right ankle that began about 1 week ago.  She has never had this happen before.  She states that she went to [place] last week and wore a pair of wedge shoes that she has worn in the past.  Now she is having throbbing sensation to the posterior ankle.  She presents for further treatment and evaluation.  She denies a history of injury.  Currently she has not done anything for treatment    Past Medical History:  Diagnosis Date   Allergic rhinitis    pollen   Hypertension, essential    Obesity       Physical Exam: General: The patient is alert and oriented x3 in no acute distress.  Dermatology: Skin is warm, dry and supple bilateral lower extremities. Negative for open lesions or macerations.  Vascular: Palpable pedal pulses bilaterally. No edema or erythema noted. Capillary refill within normal limits.  Neurological: Epicritic and protective threshold grossly intact bilaterally.   Musculoskeletal Exam: Pain on palpation noted to the right Achilles tendon. Range of motion within normal limits. Muscle strength 5/5 in all muscle groups bilateral lower extremities.  Radiographic Exam:  No fracture or dislocation noted. Normal osseous mineralization noted.     Assessment: 1. Achilles tendinitis right    Plan of Care:  1. Patient was evaluated. Radiographs were reviewed today. 2. Injection of 0.5 mL Celestone Soluspan injected into the retrocalcaneal bursa. Care was taken to avoid direct injection into the Achilles tendon. 3.  Cam boot dispensed.  Weightbearing as tolerated x4 weeks 4.  Prescription for Medrol Dosepak 5.  Prescription for meloxicam 15 mg daily after completion of the Dosepak 6.  Return to clinic in 4 weeks   Edrick Kins, DPM [facility]  Dr. Edrick Kins, [address]                                         [place], [place] [address]                Office [phone]  Fax [phone]

## 2021-05-20 ENCOUNTER — Telehealth: Payer: Self-pay | Admitting: Podiatry

## 2021-05-20 NOTE — Telephone Encounter (Signed)
That's fine. She can come in and get a note. - Dr. Amalia Hailey

## 2021-05-20 NOTE — Telephone Encounter (Signed)
Pt would like to know if she could get a letter excusing her from jury duty. The courts are requesting it by this Friday. Please advise.

## 2021-05-21 ENCOUNTER — Encounter: Payer: Self-pay | Admitting: Podiatry

## 2021-06-12 ENCOUNTER — Other Ambulatory Visit: Payer: Self-pay

## 2021-06-12 ENCOUNTER — Ambulatory Visit (INDEPENDENT_AMBULATORY_CARE_PROVIDER_SITE_OTHER): Payer: Self-pay | Admitting: Podiatry

## 2021-06-12 DIAGNOSIS — M7661 Achilles tendinitis, right leg: Secondary | ICD-10-CM

## 2021-06-12 NOTE — Progress Notes (Signed)
   HPI: 60 y.o. female presenting today for follow-up evaluation of Achilles tendinitis to the right lower extremity.  Patient states he is doing much better.  She has no pain or tenderness.  The injection and anti-inflammatories with immobilization helped significantly.  She feels that she is 100%  She states that when she went to [place] about 1 month ago she wore a pair of wedge shoes that she had worn in the past.  She believes this elicited the posterior ankle pain.  Past Medical History:  Diagnosis Date   Allergic rhinitis    pollen   Hypertension, essential    Obesity      Physical Exam: General: The patient is alert and oriented x3 in no acute distress.  Dermatology: Skin is warm, dry and supple bilateral lower extremities. Negative for open lesions or macerations.  Vascular: Palpable pedal pulses bilaterally. No edema or erythema noted. Capillary refill within normal limits.  Neurological: Epicritic and protective threshold grossly intact bilaterally.   Musculoskeletal Exam: Negative for any significant pain on palpation   Assessment: 1.  Achilles tendinitis right; resolved   Plan of Care:  1. Patient evaluated.  2.  Patient may transition out of the cam boot.  Slowly increase activity wearing good supportive shoes and sneakers 3.  Continue meloxicam as needed 4.  Return to clinic as needed      Edrick Kins, DPM [facility]  Dr. Edrick Kins, DPM    [address], [place] [address]                Office [phone]  Fax [phone]

## 2021-07-11 ENCOUNTER — Other Ambulatory Visit: Payer: Self-pay | Admitting: Podiatry

## 2021-08-12 ENCOUNTER — Ambulatory Visit: Payer: No Typology Code available for payment source | Admitting: Emergency Medicine

## 2021-09-25 IMAGING — MG MM DIGITAL SCREENING BILAT W/ TOMO AND CAD
8 series · 8 of 24 positions shown · non-contrast
Comparison: Previous exam(s).

CLINICAL DATA: Screening.

EXAM:
DIGITAL SCREENING BILATERAL MAMMOGRAM WITH TOMOSYNTHESIS AND CAD
TECHNIQUE: Bilateral screening digital craniocaudal and mediolateral oblique
mammograms were obtained. Bilateral screening digital breast
tomosynthesis was performed. The images were evaluated with
computer-aided detection.

[R MLO synth-2D]
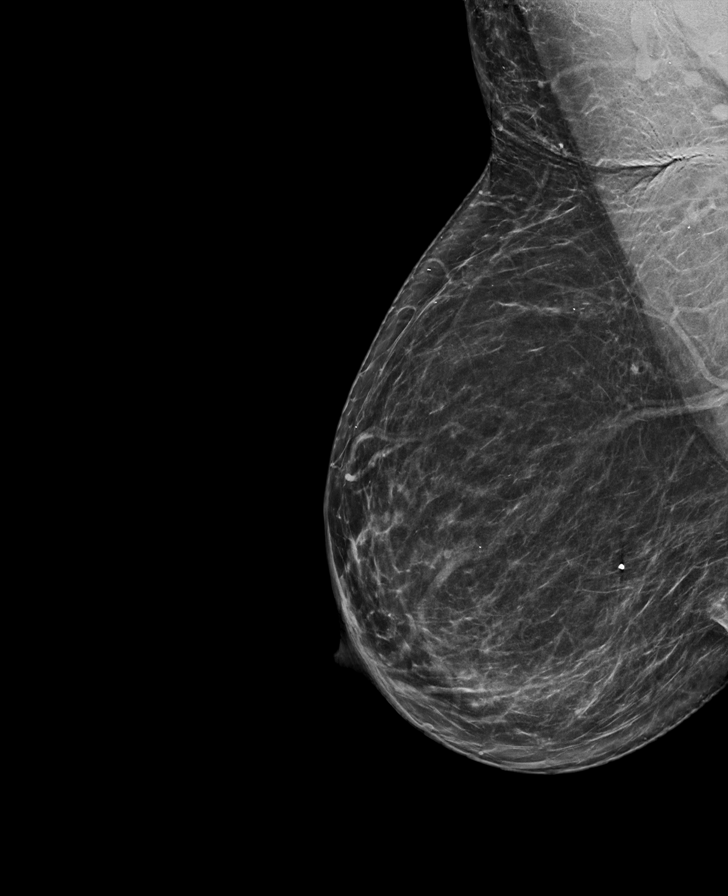

[R CC synth-2D]
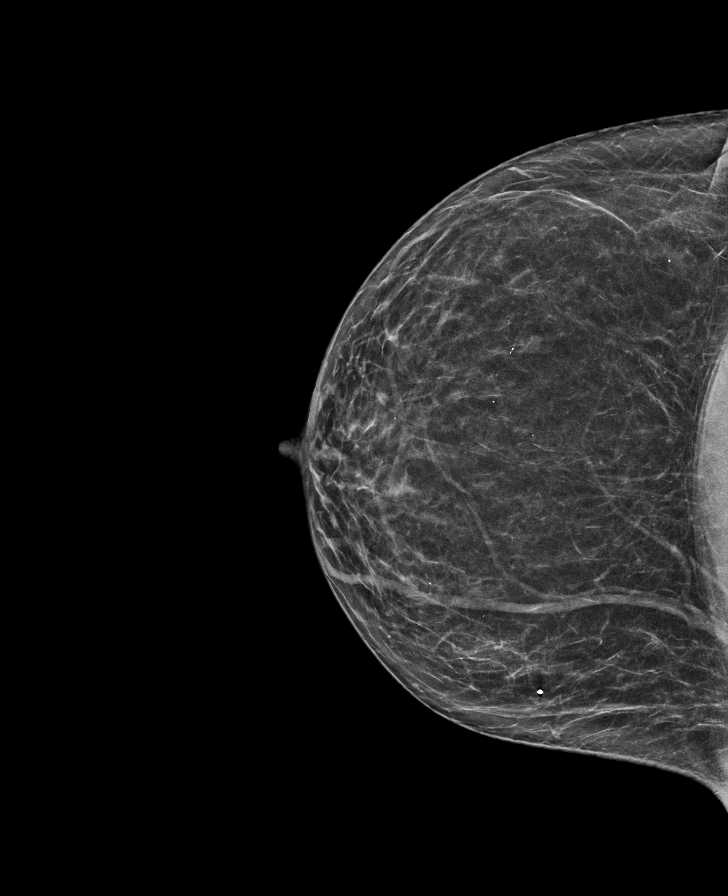

[L CC synth-2D]
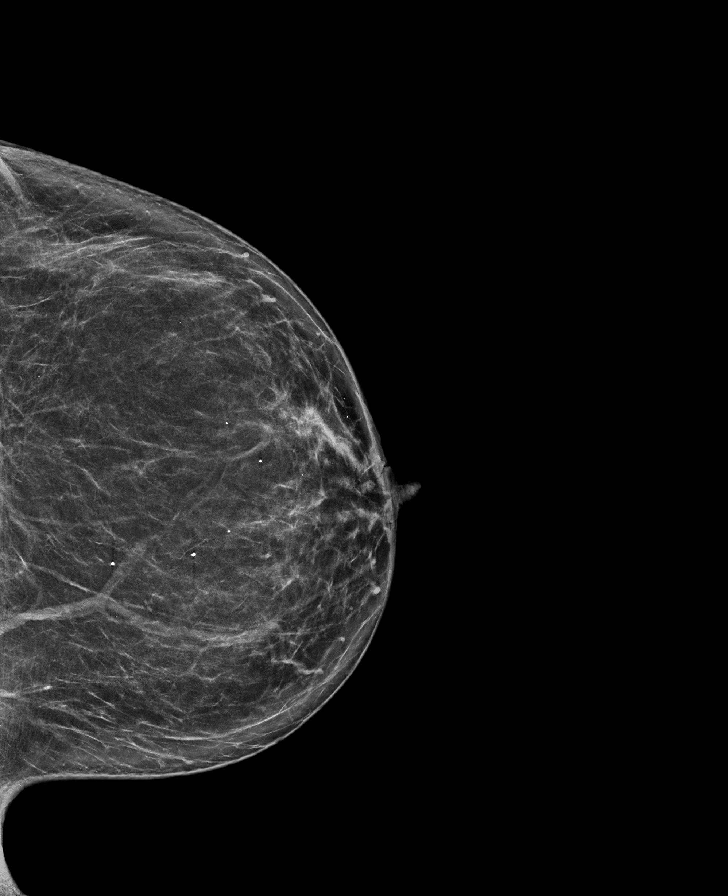

[L MLO synth-2D]
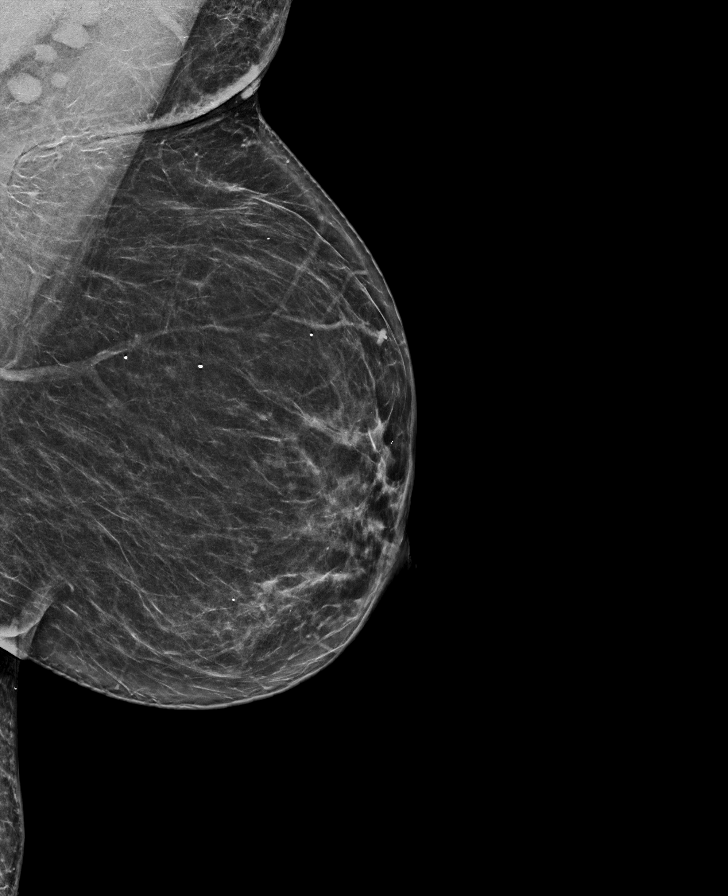

[L CC tomo · tomo slice 36/71.0]
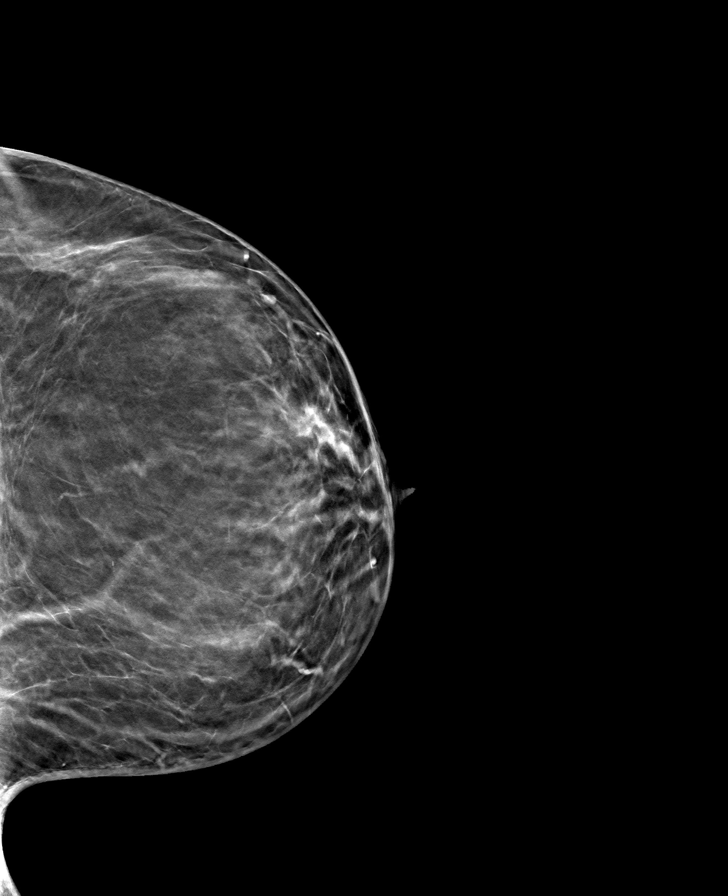

[L MLO tomo · tomo slice 36/71.0]
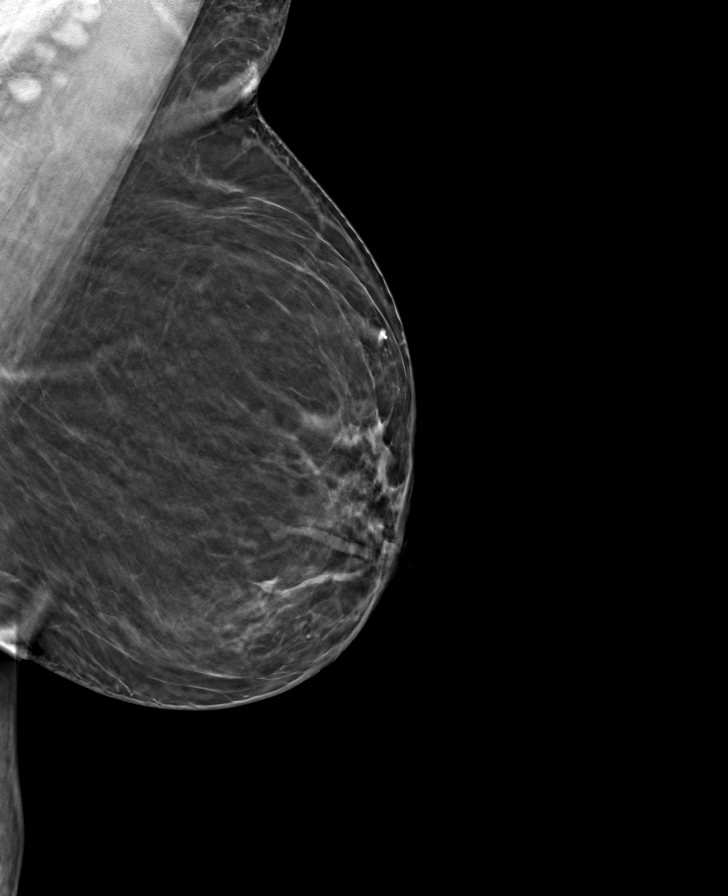

[R CC tomo · tomo slice 33/65.0]
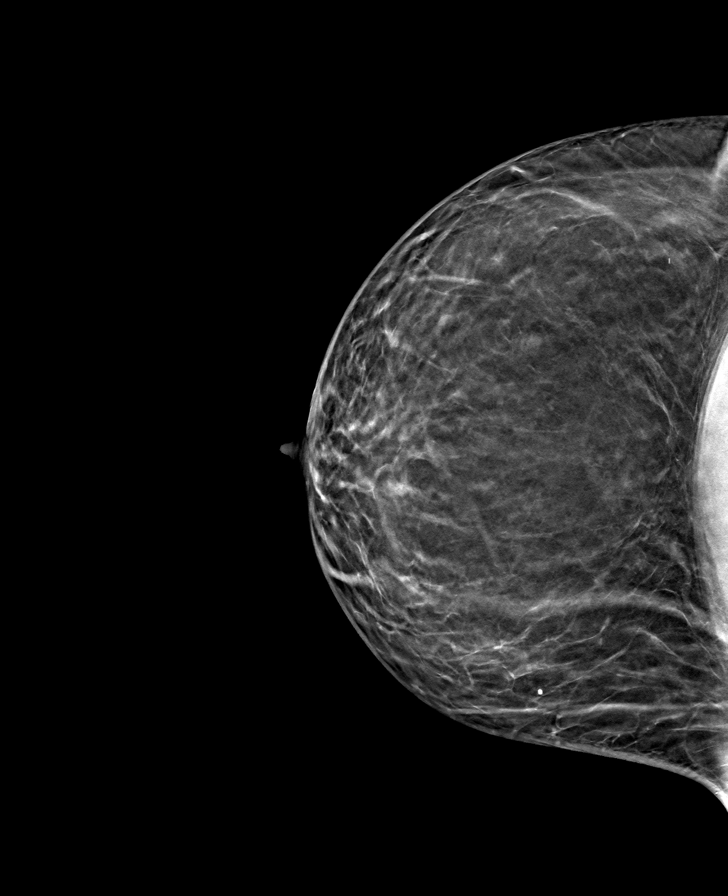

[R MLO tomo · tomo slice 37/74.0]
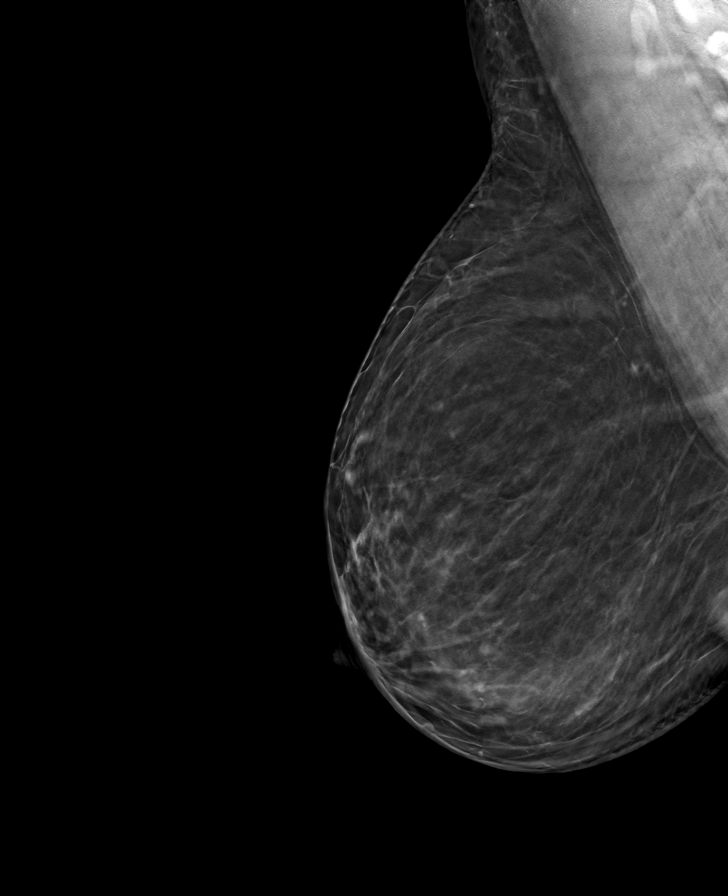

[8 of 24 positions shown; findings below may reference images not displayed]

ACR Breast Density Category b: There are scattered areas of
fibroglandular density.
FINDINGS: There are no findings suspicious for malignancy.
IMPRESSION: No mammographic evidence of malignancy. A result letter of this
screening mammogram will be mailed directly to the patient.

RECOMMENDATION:
Screening mammogram in one year. (Code:51-O-LD2)

BI-RADS CATEGORY  1: Negative.

## 2021-11-14 ENCOUNTER — Ambulatory Visit (INDEPENDENT_AMBULATORY_CARE_PROVIDER_SITE_OTHER): Payer: Self-pay | Admitting: Emergency Medicine

## 2021-11-14 ENCOUNTER — Encounter: Payer: Self-pay | Admitting: Emergency Medicine

## 2021-11-14 VITALS — BP 138/78 | HR 90 | Temp 97.8°F | Ht 63.0 in | Wt 174.5 lb

## 2021-11-14 DIAGNOSIS — E785 Hyperlipidemia, unspecified: Secondary | ICD-10-CM

## 2021-11-14 DIAGNOSIS — I10 Essential (primary) hypertension: Secondary | ICD-10-CM

## 2021-11-14 LAB — COMPREHENSIVE METABOLIC PANEL
ALT: 19 U/L (ref 0–35)
AST: 21 U/L (ref 0–37)
Albumin: 4.7 g/dL (ref 3.5–5.2)
Alkaline Phosphatase: 49 U/L (ref 39–117)
BUN: 14 mg/dL (ref 6–23)
CO2: 28 mEq/L (ref 19–32)
Calcium: 10 mg/dL (ref 8.4–10.5)
Chloride: 103 mEq/L (ref 96–112)
Creatinine, Ser: 0.85 mg/dL (ref 0.40–1.20)
GFR: 73.63 mL/min (ref >60.00)
Glucose, Bld: 96 mg/dL (ref 70–99)
Potassium: 3.9 mEq/L (ref 3.5–5.1)
Sodium: 138 mEq/L (ref 135–145)
Total Bilirubin: 0.5 mg/dL (ref 0.2–1.2)
Total Protein: 7.5 g/dL (ref 6.0–8.3)

## 2021-11-14 LAB — LIPID PANEL
Cholesterol: 235 mg/dL — ABNORMAL HIGH (ref 0–200)
HDL: 65 mg/dL (ref >39.00)
LDL Cholesterol: 148 mg/dL — ABNORMAL HIGH (ref 0–99)
NonHDL: 170.28
Total CHOL/HDL Ratio: 4
Triglycerides: 109 mg/dL (ref 0.0–149.0)
VLDL: 21.8 mg/dL (ref 0.0–40.0)

## 2021-11-14 LAB — CBC WITH DIFFERENTIAL/PLATELET
Basophils Absolute: 0 10*3/uL (ref 0.0–0.1)
Basophils Relative: 1 % (ref 0.0–3.0)
Eosinophils Absolute: 0.1 10*3/uL (ref 0.0–0.7)
Eosinophils Relative: 2.1 % (ref 0.0–5.0)
HCT: 39.7 % (ref 36.0–46.0)
Hemoglobin: 13.1 g/dL (ref 12.0–15.0)
Lymphocytes Relative: 34.1 % (ref 12.0–46.0)
Lymphs Abs: 1.6 10*3/uL (ref 0.7–4.0)
MCHC: 33.1 g/dL (ref 30.0–36.0)
MCV: 94.4 fl (ref 78.0–100.0)
Monocytes Absolute: 0.4 10*3/uL (ref 0.1–1.0)
Monocytes Relative: 7.6 % (ref 3.0–12.0)
Neutro Abs: 2.6 10*3/uL (ref 1.4–7.7)
Neutrophils Relative %: 55.2 % (ref 43.0–77.0)
Platelets: 186 10*3/uL (ref 150.0–400.0)
RBC: 4.21 Mil/uL (ref 3.87–5.11)
RDW: 13.8 % (ref 11.5–15.5)
WBC: 4.7 10*3/uL (ref 4.0–10.5)

## 2021-11-14 LAB — HEMOGLOBIN A1C: Hgb A1c MFr Bld: 5.5 % (ref 4.6–6.5)

## 2021-11-14 MED ORDER — ROSUVASTATIN CALCIUM 10 MG PO TABS: 10.0000 mg | ORAL_TABLET | Freq: Every day | ORAL | 3 refills | Status: DC

## 2021-11-14 MED ORDER — AMLODIPINE BESYLATE 10 MG PO TABS: 10.0000 mg | ORAL_TABLET | Freq: Every day | ORAL | 3 refills | Status: DC

## 2021-11-14 NOTE — Progress Notes (Signed)
Priscilla Lane ?61 y.o. ? ? ?Chief Complaint  ?Patient presents with  ? Follow-up  ? Medication Management  ?  Pt states after taking BP med. Her legs hurt   ? ? ?HISTORY OF PRESENT ILLNESS: ?This is a 61 y.o. female with history of hypertension and dyslipidemia here for follow-up. ?Was taking valsartan-HCTZ but noticed her legs were hurting.  Stopped medication for couple days and symptoms went away.  Resumed same medication and symptoms came back. ?Also has history of dyslipidemia but not taking cholesterol medication. ?No other complaints or medical concerns today. ? ?HPI ? ? ?Prior to Admission medications   ?Medication Sig Start Date End Date Taking? Authorizing Provider  ?aspirin 81 MG tablet Take 1 tablet (81 mg total) by mouth daily. 08/14/16  Yes Forrest Moron, MD  ?hydrochlorothiazide (MICROZIDE) 12.5 MG capsule Take 1 capsule (12.5 mg total) by mouth daily. 05/14/21  Yes Kianah Harries, Ines Bloomer, MD  ?ibuprofen (ADVIL) 600 MG tablet Take 1 tablet (600 mg total) by mouth every 6 (six) hours as needed for mild pain. 05/08/21  Yes Teodora Medici, FNP  ?meloxicam (MOBIC) 15 MG tablet TAKE 1 TABLET(15 MG) BY MOUTH DAILY 07/11/21  Yes Edrick Kins, DPM  ?valsartan (DIOVAN) 160 MG tablet Take 1 tablet (160 mg total) by mouth daily. 05/14/21  Yes Shona Pardo, Ines Bloomer, MD  ?rosuvastatin (CRESTOR) 10 MG tablet Take 1 tablet (10 mg total) by mouth daily. ?Patient not taking: Reported on 05/15/2021 05/07/21   Horald Pollen, MD  ? ? ?Allergies  ?Allergen Reactions  ? Amlodipine   ?  Lower extremity edema  ? Lisinopril   ?  Cough, muscle cramps  ? Cephalexin Rash  ?  Taking cephalexin for 2 days and is broken out in a facial rash with swelling of cheeks and eyelids. Incidentally, was also taking some ibuprofen at the same time but she has tolerated that well in the past and it probably is not the allergen.  ? ? ?Patient Active Problem List  ? Diagnosis Date Noted  ? Dyslipidemia 05/07/2021  ? 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 Hypertension, essential   ? Class 1 obesity due to excess calories with serious comorbidity and body mass index (BMI) of 33.0 to 33.9 in adult   ? ? ?Past Medical History:  ?Diagnosis Date  ? Allergic rhinitis   ? pollen  ? Hypertension, essential   ? Obesity   ? ? ?Past Surgical History:  ?Procedure Laterality Date  ? ABDOMINOPLASTY  2003  ? COLONOSCOPY  06/2014  ? polyp - colon tissue, mod diverticulosis, rpt 10 yrs Henrene Pastor)  ? saline infusion [place]    ? for postmenopausal bleeding/fibroids/thickened endometrium ([place])  ? ? ?Social History  ? ?Socioeconomic History  ? Marital status: Married  ?  Spouse name: Not on file  ? Number of children: 3  ? Years of education: Not on file  ? Highest education level: High school graduate  ?Occupational History  ? Not on file  ?Tobacco Use  ? Smoking status: Never  ? Smokeless tobacco: Never  ?Vaping Use  ? Vaping Use: Never used  ?Substance and Sexual Activity  ? Alcohol use: No  ?  Alcohol/week: 0.0 standard drinks  ? Drug use: No  ? Sexual activity: Not Currently  ?Other Topics Concern  ? Not on file  ?Social History Narrative  ? Lives with husband, 1 dog  ? Grown children, 3 grandchildren  ? 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 Edu: HS  ? Activity: 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  ? Diet: good water, fruits/vegetables daily  ? ?Social Determinants of Health  ? ?Financial Resource Strain: Not on file  ?Food Insecurity: No Food Insecurity  ? Worried About Charity fundraiser in the Last Year: Never true  ? Ran Out of Food in the Last Year: Never true  ?Transportation Needs: No Transportation Needs  ? Lack of Transportation (Medical): No  ? Lack of Transportation (Non-Medical): No  ?Physical Activity: Not on file  ?Stress: Not on file  ?Social Connections: Not on file  ?Intimate Partner Violence: Not on file  ? ? ?Family History  ?Problem Relation Age of Onset  ? Diabetes Mother   ? CAD Mother 80  ?     CABG  ? Hypertension  Mother   ? Kidney disease Mother   ?     ESRD on HD  ? Colon polyps Father   ? Cancer Maternal Grandmother 73  ?     GYN  ? Ovarian cancer Maternal Grandmother   ? Cancer Maternal Grandfather   ?     HEENT  ? Colon cancer Neg Hx   ? Rectal cancer Neg Hx   ? Stomach cancer Neg Hx   ? ? ? ?Review of Systems  ?Constitutional: Negative.  Negative for chills and fever.  ?HENT: Negative.  Negative for congestion and sore throat.   ?Eyes: Negative.   ?Respiratory: Negative.  Negative for cough and shortness of breath.   ?Cardiovascular: Negative.  Negative for chest pain and palpitations.  ?Gastrointestinal:  Negative for abdominal pain, diarrhea, nausea and vomiting.  ?Genitourinary: Negative.   ?Skin: Negative.  Negative for rash.  ?Neurological:  Negative for dizziness and headaches.  ?All other systems reviewed and are negative. ? ?Today's Vitals  ? 11/14/21 3557 11/14/21 3220  ?BP: (!) 150/84 (!) 152/86  ?Pulse: 90   ?Temp: 97.8 ?F (36.6 ?C)   ?TempSrc: Oral   ?SpO2: 98%   ?Weight: 174 lb 8 oz (79.2 kg)   ?Height: '5\' 3"'$  (1.6 m)   ? ?Body mass index is 30.91 kg/m?. ? ?Physical Exam ?Vitals reviewed.  ?Constitutional:   ?   Appearance: Normal appearance.  ?HENT:  ?   Head: Normocephalic.  ?Eyes:  ?   Extraocular Movements: Extraocular movements intact.  ?   Conjunctiva/sclera: Conjunctivae normal.  ?   Pupils: Pupils are equal, round, and reactive to light.  ?Cardiovascular:  ?   Rate and Rhythm: Normal rate and regular rhythm.  ?   Pulses: Normal pulses.  ?   Heart sounds: Normal heart sounds.  ?Pulmonary:  ?   Effort: Pulmonary effort is normal.  ?   Breath sounds: Normal breath sounds.  ?Musculoskeletal:     ?   General: Normal range of motion.  ?   Cervical back: No tenderness.  ?Lymphadenopathy:  ?   Cervical: No cervical adenopathy.  ?Skin: ?   General: Skin is warm and dry.  ?Neurological:  ?   General: No focal deficit present.  ?   Mental Status: She is alert and oriented to person, place, and time.   ?Psychiatric:     ?   Mood and Affect: Mood normal.     ?   Behavior: Behavior normal.  ? ? ? ?ASSESSMENT & PLAN: ?A total of 47 minutes was spent with the patient and counseling/coordination of care regarding preparing for this visit, review of most recent office visit notes, review of  most recent blood work results, review of multiple chronic medical problems and their management, review of all medications and changes made, cardiovascular risks associated with hypertension and dyslipidemia, education on nutrition, anxiety and depression management, prognosis, documentation and need for follow-up. ? ?Problem List Items Addressed This Visit   ? ?  ? Cardiovascular and Mediastinum  ? Hypertension, essential - Primary  ?  Slightly elevated readings in the office ?At home readings about 140/80 ?Side effects from valsartan-HCTZ.  We will stop. ?Start amlodipine 10 mg daily. ?Advised to monitor blood pressure readings at home daily for several weeks and keep a log ?Diet and nutrition discussed ?Follow-up in 6 months, earlier as needed ? ?  ?  ? Relevant Medications  ? rosuvastatin (CRESTOR) 10 MG tablet  ? amLODipine (NORVASC) 10 MG tablet  ? Other Relevant Orders  ? CBC with Differential/Platelet  ? Comprehensive metabolic panel  ? Hemoglobin A1c  ?  ? Other  ? Dyslipidemia  ?  Stable.  Diet and nutrition discussed.  Restart rosuvastatin 10 mg daily ?Cardiovascular risk associated with hypertension and dyslipidemia discussed. ? ?  ?  ? Relevant Medications  ? rosuvastatin (CRESTOR) 10 MG tablet  ? Other Relevant Orders  ? Hemoglobin A1c  ? Lipid panel  ? ?Patient Instructions  ?Stop valsartan-HCTZ. ?Start amlodipine 10 mg daily. ?Start rosuvastatin 10 mg daily. ?Monitor blood pressure readings at home daily for several weeks and keep a log ?Follow-up in 6 months, earlier as needed. ?Blood work today. ? ?Hypertension, Adult ?High blood pressure (hypertension) is when the force of blood pumping through the arteries is  too strong. The arteries are the blood vessels that carry blood from the heart throughout the body. Hypertension forces the heart to work harder to pump blood and may cause arteries to become narrow or stiff. Untreat

## 2021-11-14 NOTE — Assessment & Plan Note (Signed)
Slightly elevated readings in the office ?At home readings about 140/80 ?Side effects from valsartan-HCTZ.  We will stop. ?Start amlodipine 10 mg daily. ?Advised to monitor blood pressure readings at home daily for several weeks and keep a log ?Diet and nutrition discussed ?Follow-up in 6 months, earlier as needed ?

## 2021-11-14 NOTE — Assessment & Plan Note (Signed)
Stable.  Diet and nutrition discussed.  Restart rosuvastatin 10 mg daily ?Cardiovascular risk associated with hypertension and dyslipidemia discussed. ?

## 2021-11-14 NOTE — Patient Instructions (Signed)
Stop valsartan-HCTZ. ?Start amlodipine 10 mg daily. ?Start rosuvastatin 10 mg daily. ?Monitor blood pressure readings at home daily for several weeks and keep a log ?Follow-up in 6 months, earlier as needed. ?Blood work today. ? ?Hypertension, Adult ?High blood pressure (hypertension) is when the force of blood pumping through the arteries is too strong. The arteries are the blood vessels that carry blood from the heart throughout the body. Hypertension forces the heart to work harder to pump blood and may cause arteries to become narrow or stiff. Untreated or uncontrolled hypertension can lead to a heart attack, heart failure, a stroke, kidney disease, and other problems. ?A blood pressure reading consists of a higher number over a lower number. Ideally, your blood pressure should be below 120/80. The first ("top") number is called the systolic pressure. It is a measure of the pressure in your arteries as your heart beats. The second ("bottom") number is called the diastolic pressure. It is a measure of the pressure in your arteries as the heart relaxes. ?What are the causes? ?The exact cause of this condition is not known. There are some conditions that result in high blood pressure. ?What increases the risk? ?Certain factors may make you more likely to develop high blood pressure. Some of these risk factors are under your control, including: ?Smoking. ?Not getting enough exercise or physical activity. ?Being overweight. ?Having too much fat, sugar, calories, or salt (sodium) in your diet. ?Drinking too much alcohol. ?Other risk factors include: ?Having a personal history of heart disease, diabetes, high cholesterol, or kidney disease. ?Stress. ?Having a family history of high blood pressure and high cholesterol. ?Having obstructive sleep apnea. ?Age. The risk increases with age. ?What are the signs or symptoms? ?High blood pressure may not cause symptoms. Very high blood pressure (hypertensive crisis) may  cause: ?Headache. ?Fast or irregular heartbeats (palpitations). ?Shortness of breath. ?Nosebleed. ?Nausea and vomiting. ?Vision changes. ?Severe chest pain, dizziness, and seizures. ?How is this diagnosed? ?This condition is diagnosed by measuring your blood pressure while you are seated, with your arm resting on a flat surface, your legs uncrossed, and your feet flat on the floor. The cuff of the blood pressure monitor will be placed directly against the skin of your upper arm at the level of your heart. Blood pressure should be measured at least twice using the same arm. Certain conditions can cause a difference in blood pressure between your right and left arms. ?If you have a high blood pressure reading during one visit or you have normal blood pressure with other risk factors, you may be asked to: ?Return on a different day to have your blood pressure checked again. ?Monitor your blood pressure at home for 1 week or longer. ?If you are diagnosed with hypertension, you may have other blood or imaging tests to help your health care provider understand your overall risk for other conditions. ?How is this treated? ?This condition is treated by making healthy lifestyle changes, such as eating healthy foods, exercising more, and reducing your alcohol intake. You may be referred for counseling on a healthy diet and physical activity. ?Your health care provider may prescribe medicine if lifestyle changes are not enough to get your blood pressure under control and if: ?Your systolic blood pressure is above 130. ?Your diastolic blood pressure is above 80. ?Your personal target blood pressure may vary depending on your medical conditions, your age, and other factors. ?Follow these instructions at home: ?Eating and drinking ? ?Eat a diet that is  high in fiber and potassium, and low in sodium, added sugar, and fat. An example of this eating plan is called the DASH diet. DASH stands for Dietary Approaches to Stop  Hypertension. To eat this way: ?Eat plenty of fresh fruits and vegetables. Try to fill one half of your plate at each meal with fruits and vegetables. ?Eat whole grains, such as whole-wheat pasta, brown rice, or whole-grain bread. Fill about one fourth of your plate with whole grains. ?Eat or drink low-fat dairy products, such as skim milk or low-fat yogurt. ?Avoid fatty cuts of meat, processed or cured meats, and poultry with skin. Fill about one fourth of your plate with lean proteins, such as fish, chicken without skin, beans, eggs, or tofu. ?Avoid pre-made and processed foods. These tend to be higher in sodium, added sugar, and fat. ?Reduce your daily sodium intake. Many people with hypertension should eat less than 1,500 mg of sodium a day. ?Do not drink alcohol if: ?Your health care provider tells you not to drink. ?You are pregnant, may be pregnant, or are planning to become pregnant. ?If you drink alcohol: ?Limit how much you have to: ?0-1 drink a day for women. ?0-2 drinks a day for men. ?Know how much alcohol is in your drink. In the [place] one drink equals one 12 oz bottle of beer (355 mL), one 5 oz glass of wine (148 mL), or one 1? oz glass of hard liquor (44 mL). ?Lifestyle ? ?Work with your health care provider to maintain a healthy body weight or to lose weight. Ask what an ideal weight is for you. ?Get at least 30 minutes of exercise that causes your heart to beat faster (aerobic exercise) most days of the week. Activities may include walking, swimming, or biking. ?Include exercise to strengthen your muscles (resistance exercise), such as Pilates or lifting weights, as part of your weekly exercise routine. Try to do these types of exercises for 30 minutes at least 3 days a week. ?Do not use any products that contain nicotine or tobacco. These products include cigarettes, chewing tobacco, and vaping devices, such as e-cigarettes. If you need help quitting, ask your health care provider. ?Monitor your  blood pressure at home as told by your health care provider. ?Keep all follow-up visits. This is important. ?Medicines ?Take over-the-counter and prescription medicines only as told by your health care provider. Follow directions carefully. Blood pressure medicines must be taken as prescribed. ?Do not skip doses of blood pressure medicine. Doing this puts you at risk for problems and can make the medicine less effective. ?Ask your health care provider about side effects or reactions to medicines that you should watch for. ?Contact a health care provider if you: ?Think you are having a reaction to a medicine you are taking. ?Have headaches that keep coming back (recurring). ?Feel dizzy. ?Have swelling in your ankles. ?Have trouble with your vision. ?Get help right away if you: ?Develop a severe headache or confusion. ?Have unusual weakness or numbness. ?Feel faint. ?Have severe pain in your chest or abdomen. ?Vomit repeatedly. ?Have trouble breathing. ?These symptoms may be an emergency. Get help right away. Call 911. ?Do not wait to see if the symptoms will go away. ?Do not drive yourself to the hospital. ?Summary ?Hypertension is when the force of blood pumping through your arteries is too strong. If this condition is not controlled, it may put you at risk for serious complications. ?Your personal target blood pressure may vary depending on your medical  conditions, your age, and other factors. For most people, a normal blood pressure is less than 120/80. ?Hypertension is treated with lifestyle changes, medicines, or a combination of both. Lifestyle changes include losing weight, eating a healthy, low-sodium diet, exercising more, and limiting alcohol. ?This information is not intended to replace advice given to you by your health care provider. Make sure you discuss any questions you have with your health care provider. ?Document Revised: 05/07/2021 Document Reviewed: 05/07/2021 ?Elsevier Patient Education ? [place]. ? ?

## 2021-11-16 ENCOUNTER — Other Ambulatory Visit: Payer: Self-pay | Admitting: Emergency Medicine

## 2022-01-17 ENCOUNTER — Other Ambulatory Visit: Payer: Self-pay

## 2022-01-17 DIAGNOSIS — Z1231 Encounter for screening mammogram for malignant neoplasm of breast: Secondary | ICD-10-CM

## 2022-02-25 ENCOUNTER — Ambulatory Visit
Admission: RE | Admit: 2022-02-25 | Discharge: 2022-02-25 | Disposition: A | Discharge status: Home / Self Care | Payer: No Typology Code available for payment source | Source: Ambulatory Visit | Attending: Emergency Medicine | Admitting: Emergency Medicine

## 2022-02-25 DIAGNOSIS — Z1231 Encounter for screening mammogram for malignant neoplasm of breast: Secondary | ICD-10-CM

## 2022-04-14 ENCOUNTER — Ambulatory Visit (INDEPENDENT_AMBULATORY_CARE_PROVIDER_SITE_OTHER): Payer: Self-pay | Admitting: *Deleted

## 2022-04-14 DIAGNOSIS — Z23 Encounter for immunization: Secondary | ICD-10-CM

## 2022-05-19 ENCOUNTER — Ambulatory Visit (INDEPENDENT_AMBULATORY_CARE_PROVIDER_SITE_OTHER): Payer: Self-pay | Admitting: Emergency Medicine

## 2022-05-19 ENCOUNTER — Encounter: Payer: Self-pay | Admitting: Emergency Medicine

## 2022-05-19 VITALS — BP 142/86 | HR 89 | Temp 98.3°F | Ht 63.0 in | Wt 177.5 lb

## 2022-05-19 DIAGNOSIS — F4323 Adjustment disorder with mixed anxiety and depressed mood: Secondary | ICD-10-CM

## 2022-05-19 DIAGNOSIS — E785 Hyperlipidemia, unspecified: Secondary | ICD-10-CM

## 2022-05-19 DIAGNOSIS — I10 Essential (primary) hypertension: Secondary | ICD-10-CM

## 2022-05-19 DIAGNOSIS — J069 Acute upper respiratory infection, unspecified: Secondary | ICD-10-CM | POA: Insufficient documentation

## 2022-05-19 MED ORDER — LOSARTAN POTASSIUM 50 MG PO TABS: 50.0000 mg | ORAL_TABLET | Freq: Every day | ORAL | 1 refills | Status: DC

## 2022-05-19 NOTE — Patient Instructions (Signed)
Hypertension, Adult High blood pressure (hypertension) is when the force of blood pumping through the arteries is too strong. The arteries are the blood vessels that carry blood from the heart throughout the body. Hypertension forces the heart to work harder to pump blood and may cause arteries to become narrow or stiff. Untreated or uncontrolled hypertension can lead to a heart attack, heart failure, a stroke, kidney disease, and other problems. A blood pressure reading consists of a higher number over a lower number. Ideally, your blood pressure should be below 120/80. The first ("top") number is called the systolic pressure. It is a measure of the pressure in your arteries as your heart beats. The second ("bottom") number is called the diastolic pressure. It is a measure of the pressure in your arteries as the heart relaxes. What are the causes? The exact cause of this condition is not known. There are some conditions that result in high blood pressure. What increases the risk? Certain factors may make you more likely to develop high blood pressure. Some of these risk factors are under your control, including: Smoking. Not getting enough exercise or physical activity. Being overweight. Having too much fat, sugar, calories, or salt (sodium) in your diet. Drinking too much alcohol. Other risk factors include: Having a personal history of heart disease, diabetes, high cholesterol, or kidney disease. Stress. Having a family history of high blood pressure and high cholesterol. Having obstructive sleep apnea. Age. The risk increases with age. What are the signs or symptoms? High blood pressure may not cause symptoms. Very high blood pressure (hypertensive crisis) may cause: Headache. Fast or irregular heartbeats (palpitations). Shortness of breath. Nosebleed. Nausea and vomiting. Vision changes. Severe chest pain, dizziness, and seizures. How is this diagnosed? This condition is diagnosed by  measuring your blood pressure while you are seated, with your arm resting on a flat surface, your legs uncrossed, and your feet flat on the floor. The cuff of the blood pressure monitor will be placed directly against the skin of your upper arm at the level of your heart. Blood pressure should be measured at least twice using the same arm. Certain conditions can cause a difference in blood pressure between your right and left arms. If you have a high blood pressure reading during one visit or you have normal blood pressure with other risk factors, you may be asked to: Return on a different day to have your blood pressure checked again. Monitor your blood pressure at home for 1 week or longer. If you are diagnosed with hypertension, you may have other blood or imaging tests to help your health care provider understand your overall risk for other conditions. How is this treated? This condition is treated by making healthy lifestyle changes, such as eating healthy foods, exercising more, and reducing your alcohol intake. You may be referred for counseling on a healthy diet and physical activity. Your health care provider may prescribe medicine if lifestyle changes are not enough to get your blood pressure under control and if: Your systolic blood pressure is above 130. Your diastolic blood pressure is above 80. Your personal target blood pressure may vary depending on your medical conditions, your age, and other factors. Follow these instructions at home: Eating and drinking  Eat a diet that is high in fiber and potassium, and low in sodium, added sugar, and fat. An example of this eating plan is called the DASH diet. DASH stands for Dietary Approaches to Stop Hypertension. To eat this way: Eat   plenty of fresh fruits and vegetables. Try to fill one half of your plate at each meal with fruits and vegetables. Eat whole grains, such as whole-wheat pasta, brown rice, or whole-grain bread. Fill about one  fourth of your plate with whole grains. Eat or drink low-fat dairy products, such as skim milk or low-fat yogurt. Avoid fatty cuts of meat, processed or cured meats, and poultry with skin. Fill about one fourth of your plate with lean proteins, such as fish, chicken without skin, beans, eggs, or tofu. Avoid pre-made and processed foods. These tend to be higher in sodium, added sugar, and fat. Reduce your daily sodium intake. Many people with hypertension should eat less than 1,500 mg of sodium a day. Do not drink alcohol if: Your health care provider tells you not to drink. You are pregnant, may be pregnant, or are planning to become pregnant. If you drink alcohol: Limit how much you have to: 0-1 drink a day for women. 0-2 drinks a day for men. Know how much alcohol is in your drink. In the [place] one drink equals one 12 oz bottle of beer (355 mL), one 5 oz glass of wine (148 mL), or one 1 oz glass of hard liquor (44 mL). Lifestyle  Work with your health care provider to maintain a healthy body weight or to lose weight. Ask what an ideal weight is for you. Get at least 30 minutes of exercise that causes your heart to beat faster (aerobic exercise) most days of the week. Activities may include walking, swimming, or biking. Include exercise to strengthen your muscles (resistance exercise), such as Pilates or lifting weights, as part of your weekly exercise routine. Try to do these types of exercises for 30 minutes at least 3 days a week. Do not use any products that contain nicotine or tobacco. These products include cigarettes, chewing tobacco, and vaping devices, such as e-cigarettes. If you need help quitting, ask your health care provider. Monitor your blood pressure at home as told by your health care provider. Keep all follow-up visits. This is important. Medicines Take over-the-counter and prescription medicines only as told by your health care provider. Follow directions carefully. Blood  pressure medicines must be taken as prescribed. Do not skip doses of blood pressure medicine. Doing this puts you at risk for problems and can make the medicine less effective. Ask your health care provider about side effects or reactions to medicines that you should watch for. Contact a health care provider if you: Think you are having a reaction to a medicine you are taking. Have headaches that keep coming back (recurring). Feel dizzy. Have swelling in your ankles. Have trouble with your vision. Get help right away if you: Develop a severe headache or confusion. Have unusual weakness or numbness. Feel faint. Have severe pain in your chest or abdomen. Vomit repeatedly. Have trouble breathing. These symptoms may be an emergency. Get help right away. Call 911. Do not wait to see if the symptoms will go away. Do not drive yourself to the hospital. Summary Hypertension is when the force of blood pumping through your arteries is too strong. If this condition is not controlled, it may put you at risk for serious complications. Your personal target blood pressure may vary depending on your medical conditions, your age, and other factors. For most people, a normal blood pressure is less than 120/80. Hypertension is treated with lifestyle changes, medicines, or a combination of both. Lifestyle changes include losing weight, eating a healthy,   low-sodium diet, exercising more, and limiting alcohol. This information is not intended to replace advice given to you by your health care provider. Make sure you discuss any questions you have with your health care provider. Document Revised: 05/07/2021 Document Reviewed: 05/07/2021 Elsevier Patient Education  2023 Elsevier Inc.  

## 2022-05-19 NOTE — Assessment & Plan Note (Signed)
Stable.  No red flag signs or symptoms. 5 days into illness without complications. Progressively getting better.  Running its course. Recommend symptom control with Mucinex DM over-the-counter.

## 2022-05-19 NOTE — Assessment & Plan Note (Signed)
Stable and much controlled.

## 2022-05-19 NOTE — Assessment & Plan Note (Signed)
Stable.  Diet and nutrition discussed. Continue rosuvastatin 10 mg daily. The 10-year ASCVD risk score (Arnett DK, et al., 2019) is: 10.8%   Values used to calculate the score:     Age: 61 years     Sex: Female     Is Non-Hispanic African American: Yes     Diabetic: No     Tobacco smoker: No     Systolic Blood Pressure: 161 mmHg     Is BP treated: Yes     HDL Cholesterol: 65 mg/dL     Total Cholesterol: 235 mg/dL

## 2022-05-19 NOTE — Progress Notes (Signed)
Priscilla Lane 61 y.o.   Chief Complaint  Patient presents with   Follow-up    46mth f/u appt, patient states has been sick since last Wednesday    Cough    HISTORY OF PRESENT ILLNESS: This is a 61y.o. female here for 643-monthollow-up of hypertension. Blood pressure readings elevated at home.  Similar to once in the office. Also complaining of flulike symptoms that started 5 days ago and are progressively getting better.  Thinks she got it from her granddaughter who has similar symptoms last week.  Denies difficulty breathing.  Occasional cough.  No fever.  No other associated symptoms. No other complaints or medical concerns today. BP Readings from Last 3 Encounters:  05/19/22 (!) 142/86  11/14/21 138/78  05/08/21 (!) 153/83     Cough Pertinent negatives include no chest pain, chills, fever, headaches, rash, sore throat or shortness of breath.     Prior to Admission medications   Medication Sig Start Date End Date Taking? Authorizing Provider  amLODipine (NORVASC) 10 MG tablet Take 1 tablet (10 mg total) by mouth daily. 11/14/21  Yes Eugune Sine, Ines BloomerMD  aspirin 81 MG tablet Take 1 tablet (81 mg total) by mouth daily. 08/14/16  Yes StForrest MoronMD  ibuprofen (ADVIL) 600 MG tablet Take 1 tablet (600 mg total) by mouth every 6 (six) hours as needed for mild pain. 05/08/21  Yes [place], HaHildred Alamin, FNP  meloxicam (MOBIC) 15 MG tablet TAKE 1 TABLET(15 MG) BY MOUTH DAILY 07/11/21  Yes EvEdrick KinsDPM  rosuvastatin (CRESTOR) 10 MG tablet Take 1 tablet (10 mg total) by mouth daily. 11/14/21  Yes SaNecedahInes BloomerMD    Allergies  Allergen Reactions   Amlodipine     Lower extremity edema   Lisinopril     Cough, muscle cramps   Cephalexin Rash    Taking cephalexin for 2 days and is broken out in a facial rash with swelling of cheeks and eyelids. Incidentally, was also taking some ibuprofen at the same time but she has tolerated that well in the past and it probably is  not the allergen.    Patient Active Problem List   Diagnosis Date Noted   Dyslipidemia 05/07/2021   Situational mixed anxiety and depressive disorder 01/31/2021   Hypertension, essential    Class 1 obesity due to excess calories with serious comorbidity and body mass index (BMI) of 33.0 to 33.9 in adult     Past Medical History:  Diagnosis Date   Allergic rhinitis    pollen   Hypertension, essential    Obesity     Past Surgical History:  Procedure Laterality Date   ABDOMINOPLASTY  2003   COLONOSCOPY  06/2014   polyp - colon tissue, mod diverticulosis, rpt 10 yrs ([facility]  s[place]   for postmenopausal bleeding/fibroids/thickened endometrium ([facility], clinical (histocompatibility and immunogenetics)   Social History   Socioeconomic History   Marital status: Married    Spouse name: Not on file   Number of children: 3   Years of education: Not on file   Highest education level: High school graduate  Occupational History   Not on file  Tobacco Use   Smoking status: Never   Smokeless tobacco: Never  Vaping Use   Vaping Use: Never used  Substance and Sexual Activity   Alcohol use: No    Alcohol/week: 0.0 standard drinks of alcohol   Drug use: No   Sexual activity: Not Currently  Other  Topics Concern   Not on file  Social History Narrative   Lives with husband, 1 dog   Grown children, 3 grandchildren   Occupation: homemaker, 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   Edu: HS   Activity: 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   Diet: good water, fruits/vegetables daily   Social Determinants of Health   Financial Resource Strain: Not on file  Food Insecurity: No Food Insecurity (02/14/2021)   Hunger Vital Sign    Worried About Running Out of Food in the Last Year: Never true    [place] in the Last Year: Never true  Transportation Needs: No Transportation Needs (02/14/2021)   PRAPARE - Hydrologist (Medical): No    Lack of Transportation (Non-Medical): No  Physical Activity: Not on file   Stress: Not on file  Social Connections: Not on file  Intimate Partner Violence: Not on file    Family History  Problem Relation Age of Onset   Diabetes Mother    CAD Mother 70       CABG   Hypertension Mother    Kidney disease Mother        ESRD on HD   Colon polyps Father    Cancer Maternal Grandmother 53       GYN   Ovarian cancer Maternal Grandmother    Cancer Maternal Grandfather        HEENT   Colon cancer Neg Hx    Rectal cancer Neg Hx    Stomach cancer Neg Hx      Review of Systems  Constitutional: Negative.  Negative for chills and fever.  HENT:  Positive for congestion. Negative for sore throat.   Respiratory:  Positive for cough. Negative for sputum production and shortness of breath.   Cardiovascular: Negative.  Negative for chest pain and palpitations.  Gastrointestinal:  Negative for abdominal pain, nausea and vomiting.  Genitourinary: Negative.   Skin: Negative.  Negative for rash.  Neurological: Negative.  Negative for dizziness and headaches.  All other systems reviewed and are negative.   Today's Vitals   05/19/22 0844 05/19/22 0848  BP: (!) 146/88 (!) 142/86  Pulse: 89   Temp: 98.3 F (36.8 C)   TempSrc: Oral   SpO2: 98%   Weight: 177 lb 8 oz (80.5 kg)   Height: '5\' 3"'$  (1.6 m)    Body mass index is 31.44 kg/m. Wt Readings from Last 3 Encounters:  05/19/22 177 lb 8 oz (80.5 kg)  11/14/21 174 lb 8 oz (79.2 kg)  05/07/21 168 lb (76.2 kg)    Physical Exam Vitals reviewed.  Constitutional:      Appearance: Normal appearance.  HENT:     Head: Normocephalic.     Mouth/Throat:     Mouth: Mucous membranes are moist.     Pharynx: Oropharynx is clear.  Eyes:     Extraocular Movements: Extraocular movements intact.     Conjunctiva/sclera: Conjunctivae normal.     Pupils: Pupils are equal, round, and reactive to light.  Cardiovascular:     Rate and Rhythm: Normal rate and regular rhythm.     Pulses: Normal pulses.     Heart sounds: Normal  heart sounds.  Pulmonary:     Effort: Pulmonary effort is normal.     Breath sounds: Normal breath sounds.  Musculoskeletal:     Cervical back: No tenderness.     Right lower leg: No edema.     Left lower leg: No edema.  Lymphadenopathy:  Cervical: No cervical adenopathy.  Skin:    General: Skin is warm and dry.  Neurological:     General: No focal deficit present.     Mental Status: She is alert and oriented to person, place, and time.  Psychiatric:        Mood and Affect: Mood normal.        Behavior: Behavior normal.      ASSESSMENT & PLAN: A total of 47 minutes was spent with the patient and counseling/coordination of care regarding preparing for this visit, review of most recent office visit notes, review of multiple chronic medical problems and their management, review of all medications, review of most recent blood work results, education on nutrition, prognosis, documentation, need for follow-up.  Problem List Items Addressed This Visit       Cardiovascular and Mediastinum   Hypertension, essential - Primary    Elevated blood pressure readings at home and in the office. Recommend to continue amlodipine 10 mg and start losartan 50 mg daily. Dietary approaches to stop hypertension discussed. Continue monitoring blood pressure readings at home and keep a log.  Advised to contact the office if numbers persistently abnormal.      Relevant Medications   losartan (COZAAR) 50 MG tablet     Respiratory   Upper respiratory infection, viral    Stable.  No red flag signs or symptoms. 5 days into illness without complications. Progressively getting better.  Running its course. Recommend symptom control with Mucinex DM over-the-counter.        Other   Situational mixed anxiety and depressive disorder    Stable and much controlled.      Dyslipidemia    Stable.  Diet and nutrition discussed. Continue rosuvastatin 10 mg daily. The 10-year ASCVD risk score (Arnett DK,  et al., 2019) is: 10.8%   Values used to calculate the score:     Age: 11 years     Sex: Female     Is Non-Hispanic African American: Yes     Diabetic: No     Tobacco smoker: No     Systolic Blood Pressure: 433 mmHg     Is BP treated: Yes     HDL Cholesterol: 65 mg/dL     Total Cholesterol: 235 mg/dL       Patient Instructions  Hypertension, Adult High blood pressure (hypertension) is when the force of blood pumping through the arteries is too strong. The arteries are the blood vessels that carry blood from the heart throughout the body. Hypertension forces the heart to work harder to pump blood and may cause arteries to become narrow or stiff. Untreated or uncontrolled hypertension can lead to a heart attack, heart failure, a stroke, kidney disease, and other problems. A blood pressure reading consists of a higher number over a lower number. Ideally, your blood pressure should be below 120/80. The first ("top") number is called the systolic pressure. It is a measure of the pressure in your arteries as your heart beats. The second ("bottom") number is called the diastolic pressure. It is a measure of the pressure in your arteries as the heart relaxes. What are the causes? The exact cause of this condition is not known. There are some conditions that result in high blood pressure. What increases the risk? Certain factors may make you more likely to develop high blood pressure. Some of these risk factors are under your control, including: Smoking. Not getting enough exercise or physical activity. Being overweight. Having too much  fat, sugar, calories, or salt (sodium) in your diet. Drinking too much alcohol. Other risk factors include: Having a personal history of heart disease, diabetes, high cholesterol, or kidney disease. Stress. Having a family history of high blood pressure and high cholesterol. Having obstructive sleep apnea. Age. The risk increases with age. What are the signs  or symptoms? High blood pressure may not cause symptoms. Very high blood pressure (hypertensive crisis) may cause: Headache. Fast or irregular heartbeats (palpitations). Shortness of breath. Nosebleed. Nausea and vomiting. Vision changes. Severe chest pain, dizziness, and seizures. How is this diagnosed? This condition is diagnosed by measuring your blood pressure while you are seated, with your arm resting on a flat surface, your legs uncrossed, and your feet flat on the floor. The cuff of the blood pressure monitor will be placed directly against the skin of your upper arm at the level of your heart. Blood pressure should be measured at least twice using the same arm. Certain conditions can cause a difference in blood pressure between your right and left arms. If you have a high blood pressure reading during one visit or you have normal blood pressure with other risk factors, you may be asked to: Return on a different day to have your blood pressure checked again. Monitor your blood pressure at home for 1 week or longer. If you are diagnosed with hypertension, you may have other blood or imaging tests to help your health care provider understand your overall risk for other conditions. How is this treated? This condition is treated by making healthy lifestyle changes, such as eating healthy foods, exercising more, and reducing your alcohol intake. You may be referred for counseling on a healthy diet and physical activity. Your health care provider may prescribe medicine if lifestyle changes are not enough to get your blood pressure under control and if: Your systolic blood pressure is above 130. Your diastolic blood pressure is above 80. Your personal target blood pressure may vary depending on your medical conditions, your age, and other factors. Follow these instructions at home: Eating and drinking  Eat a diet that is high in fiber and potassium, and low in sodium, added sugar, and fat.  An example of this eating plan is called the DASH diet. DASH stands for Dietary Approaches to Stop Hypertension. To eat this way: Eat plenty of fresh fruits and vegetables. Try to fill one half of your plate at each meal with fruits and vegetables. Eat whole grains, such as whole-wheat pasta, brown rice, or whole-grain bread. Fill about one fourth of your plate with whole grains. Eat or drink low-fat dairy products, such as skim milk or low-fat yogurt. Avoid fatty cuts of meat, processed or cured meats, and poultry with skin. Fill about one fourth of your plate with lean proteins, such as fish, chicken without skin, beans, eggs, or tofu. Avoid pre-made and processed foods. These tend to be higher in sodium, added sugar, and fat. Reduce your daily sodium intake. Many people with hypertension should eat less than 1,500 mg of sodium a day. Do not drink alcohol if: Your health care provider tells you not to drink. You are pregnant, may be pregnant, or are planning to become pregnant. If you drink alcohol: Limit how much you have to: 0-1 drink a day for women. 0-2 drinks a day for men. Know how much alcohol is in your drink. In the [place] one drink equals one 12 oz bottle of beer (355 mL), one 5 oz glass of wine (  148 mL), or one 1 oz glass of hard liquor (44 mL). Lifestyle  Work with your health care provider to maintain a healthy body weight or to lose weight. Ask what an ideal weight is for you. Get at least 30 minutes of exercise that causes your heart to beat faster (aerobic exercise) most days of the week. Activities may include walking, swimming, or biking. Include exercise to strengthen your muscles (resistance exercise), such as Pilates or lifting weights, as part of your weekly exercise routine. Try to do these types of exercises for 30 minutes at least 3 days a week. Do not use any products that contain nicotine or tobacco. These products include cigarettes, chewing tobacco, and vaping  devices, such as e-cigarettes. If you need help quitting, ask your health care provider. Monitor your blood pressure at home as told by your health care provider. Keep all follow-up visits. This is important. Medicines Take over-the-counter and prescription medicines only as told by your health care provider. Follow directions carefully. Blood pressure medicines must be taken as prescribed. Do not skip doses of blood pressure medicine. Doing this puts you at risk for problems and can make the medicine less effective. Ask your health care provider about side effects or reactions to medicines that you should watch for. Contact a health care provider if you: Think you are having a reaction to a medicine you are taking. Have headaches that keep coming back (recurring). Feel dizzy. Have swelling in your ankles. Have trouble with your vision. Get help right away if you: Develop a severe headache or confusion. Have unusual weakness or numbness. Feel faint. Have severe pain in your chest or abdomen. Vomit repeatedly. Have trouble breathing. These symptoms may be an emergency. Get help right away. Call 911. Do not wait to see if the symptoms will go away. Do not drive yourself to the hospital. Summary Hypertension is when the force of blood pumping through your arteries is too strong. If this condition is not controlled, it may put you at risk for serious complications. Your personal target blood pressure may vary depending on your medical conditions, your age, and other factors. For most people, a normal blood pressure is less than 120/80. Hypertension is treated with lifestyle changes, medicines, or a combination of both. Lifestyle changes include losing weight, eating a healthy, low-sodium diet, exercising more, and limiting alcohol. This information is not intended to replace advice given to you by your health care provider. Make sure you discuss any questions you have with your health care  provider. Document Revised: 05/07/2021 Document Reviewed: 05/07/2021 Elsevier Patient Education  [place], MD [facility]

## 2022-05-19 NOTE — Assessment & Plan Note (Addendum)
Elevated blood pressure readings at home and in the office. Recommend to continue amlodipine 10 mg and start losartan 50 mg daily. Dietary approaches to stop hypertension discussed. Continue monitoring blood pressure readings at home and keep a log.  Advised to contact the office if numbers persistently abnormal.

## 2022-06-04 ENCOUNTER — Ambulatory Visit (INDEPENDENT_AMBULATORY_CARE_PROVIDER_SITE_OTHER): Payer: Self-pay | Admitting: Emergency Medicine

## 2022-06-04 ENCOUNTER — Encounter: Payer: Self-pay | Admitting: Emergency Medicine

## 2022-06-04 VITALS — BP 128/76 | HR 74 | Temp 98.4°F | Ht 63.0 in | Wt 177.2 lb

## 2022-06-04 DIAGNOSIS — I10 Essential (primary) hypertension: Secondary | ICD-10-CM

## 2022-06-04 DIAGNOSIS — S8012XA Contusion of left lower leg, initial encounter: Secondary | ICD-10-CM | POA: Insufficient documentation

## 2022-06-04 NOTE — Progress Notes (Signed)
Priscilla Lane 61 y.o.   Chief Complaint  Patient presents with   Acute Visit    Bruise and knot on left leg, x 2 weeks    HISTORY OF PRESENT ILLNESS: This is a 61 y.o. female complaining of bruise to left leg noticed about 2 weeks ago. Takes daily baby aspirin.  Cannot recall any significant injuries. No other associated symptoms. No other complaints or medical concerns today.  HPI   Prior to Admission medications   Medication Sig Start Date End Date Taking? Authorizing Provider  amLODipine (NORVASC) 10 MG tablet Take 1 tablet (10 mg total) by mouth daily. 11/14/21  Yes SagardiaInes Bloomer, MD  aspirin 81 MG tablet Take 1 tablet (81 mg total) by mouth daily. 08/14/16  Yes Forrest Moron, MD  ibuprofen (ADVIL) 600 MG tablet Take 1 tablet (600 mg total) by mouth every 6 (six) hours as needed for mild pain. 05/08/21  Yes [place], Hildred Alamin E, FNP  losartan (COZAAR) 50 MG tablet Take 1 tablet (50 mg total) by mouth daily. 05/19/22  Yes Horald Pollen, MD  meloxicam (MOBIC) 15 MG tablet TAKE 1 TABLET(15 MG) BY MOUTH DAILY 07/11/21  Yes Edrick Kins, DPM  rosuvastatin (CRESTOR) 10 MG tablet Take 1 tablet (10 mg total) by mouth daily. 11/14/21  Yes AvalonInes Bloomer, MD    Allergies  Allergen Reactions   Amlodipine     Lower extremity edema   Lisinopril     Cough, muscle cramps   Cephalexin Rash    Taking cephalexin for 2 days and is broken out in a facial rash with swelling of cheeks and eyelids. Incidentally, was also taking some ibuprofen at the same time but she has tolerated that well in the past and it probably is not the allergen.    Patient Active Problem List   Diagnosis Date Noted   Dyslipidemia 05/07/2021   Situational mixed anxiety and depressive disorder 01/31/2021   Hypertension, essential    Class 1 obesity due to excess calories with serious comorbidity and body mass index (BMI) of 33.0 to 33.9 in adult     Past Medical History:  Diagnosis Date    Allergic rhinitis    pollen   Hypertension, essential    Obesity     Past Surgical History:  Procedure Laterality Date   ABDOMINOPLASTY  2003   COLONOSCOPY  06/2014   polyp - colon tissue, mod diverticulosis, rpt 10 yrs Henrene Pastor)   saline infusion [place]     for postmenopausal bleeding/fibroids/thickened endometrium Scientist, clinical (histocompatibility and immunogenetics))    Social History   Socioeconomic History   Marital status: Married    Spouse name: Not on file   Number of children: 3   Years of education: Not on file   Highest education level: High school graduate  Occupational History   Not on file  Tobacco Use   Smoking status: Never   Smokeless tobacco: Never  Vaping Use   Vaping Use: Never used  Substance and Sexual Activity   Alcohol use: No    Alcohol/week: 0.0 standard drinks of alcohol   Drug use: No   Sexual activity: Not Currently  Other Topics Concern   Not on file  Social History Narrative   Lives with husband, 1 dog   Grown children, 3 grandchildren   Occupation: homemaker, 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   Edu: HS   Activity: 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   Diet: good water, fruits/vegetables daily   Social Determinants of Health   Financial Resource Strain:  Not on file  Food Insecurity: No Food Insecurity (02/14/2021)   Hunger Vital Sign    Worried About Running Out of Food in the Last Year: Never true    Ran Out of Food in the Last Year: Never true  Transportation Needs: No Transportation Needs (02/14/2021)   PRAPARE - Hydrologist (Medical): No    Lack of Transportation (Non-Medical): No  Physical Activity: Not on file  Stress: Not on file  Social Connections: Not on file  Intimate Partner Violence: Not on file    Family History  Problem Relation Age of Onset   Diabetes Mother    CAD Mother 34       CABG   Hypertension Mother    Kidney disease Mother        ESRD on HD   Colon polyps Father    Cancer Maternal Grandmother 59       GYN   Ovarian cancer Maternal  Grandmother    Cancer Maternal Grandfather        HEENT   Colon cancer Neg Hx    Rectal cancer Neg Hx    Stomach cancer Neg Hx      Review of Systems  Constitutional: Negative.  Negative for chills and fever.  HENT: Negative.  Negative for congestion and sore throat.   Respiratory: Negative.  Negative for cough and shortness of breath.   Cardiovascular: Negative.  Negative for chest pain and palpitations.  Gastrointestinal:  Negative for abdominal pain, nausea and vomiting.  Neurological:  Negative for dizziness and headaches.  All other systems reviewed and are negative.   Today's Vitals   06/04/22 0927  Weight: 177 lb 4 oz (80.4 kg)  Height: '5\' 3"'$  (1.6 m)   Body mass index is 31.4 kg/m.  Physical Exam Vitals reviewed.  Constitutional:      Appearance: Normal appearance.  HENT:     Head: Normocephalic.  Eyes:     Extraocular Movements: Extraocular movements intact.  Cardiovascular:     Rate and Rhythm: Normal rate.  Pulmonary:     Effort: Pulmonary effort is normal.  Musculoskeletal:     Comments: Left lower extremity: Small palpable nontender hematoma to lateral aspect of upper tib-fib area with surrounding old ecchymotic skin.  See picture below.  Skin:    General: Skin is warm and dry.  Neurological:     Mental Status: She is alert and oriented to person, place, and time.  Psychiatric:        Mood and Affect: Mood normal.        Behavior: Behavior normal.      ASSESSMENT & PLAN: A total of 30 minutes was spent with the patient and counseling/coordination of care regarding preparing for this visit, review of most recent office visit notes, review of chronic medical problems and management, review of all medications, diagnosis of hematoma and treatment, prognosis, documentation, and need for follow-up.  Problem List Items Addressed This Visit       Cardiovascular and Mediastinum   Hypertension, essential    Well-controlled hypertension. Continue losartan  50 mg daily. Continue daily baby aspirin.        Other   Leg hematoma, left, initial encounter - Primary  Benign physical findings.  Most likely related to minor injury.  No complications.  No red flag signs or symptoms. No specific treatment needed at present time. Advised to contact the office if clinical picture changes or condition worsens.  Patient Instructions  Hematoma A hematoma is a collection of blood. A hematoma can happen: Under the skin. In an organ. In a body space. In a joint space. In other tissues. The blood can thicken (clot) to form a lump that you can see and feel. The lump is often hard and may become sore and tender. The lump can be very small or very big. Most hematomas get better in a few days to weeks. However, some of these may be serious and need medical care. What are the causes? This condition is caused by: An injury. Blood that leaks under the skin. Problems from surgeries. Medical conditions that cause bleeding or bruising. What increases the risk? You are more likely to develop this condition if: You are an older adult. You use medicines that thin your blood. You use NSAIDs, such as ibuprofen, often for pain. You play contact sports. What are the signs or symptoms? Symptoms depend on where the hematoma is in your body. If the hematoma is under the skin, there is: A firm lump on the body. Pain and tenderness in the area. Bruising. The skin above the lump may be blue, dark blue, purple-red, or yellowish. If the hematoma is deep in the tissues or body spaces, there may be: Blood in the stomach. This may cause pain in the belly (abdomen), weakness, passing out (fainting), and shortness of breath. Blood in the head. This may cause a headache, weakness, trouble speaking or understanding speech, or passing out. How is this treated? Treatment depends on the cause, size, and location of the hematoma. Treatment may include: Doing nothing. Many  hematomas go away on their own without treatment. Surgery or close monitoring. This may be needed for large hematomas or hematomas that affect the body's organs. Medicines. These may be given if a medical condition caused the hematoma. Follow these instructions at home: Managing pain, stiffness, and swelling  If told, put ice on the injured area. To do this: Put ice in a plastic bag. Place a towel between your skin and the bag. Leave the ice on for 20 minutes, 2-3 times a day for the first two days. If your skin turns bright red, take off the ice right away to prevent skin damage. The risk of skin damage is higher if you cannot feel pain, heat, or cold. If told, put heat on the injured area. Do this as often as told by your doctor. Use the heat source that your doctor recommends, such as a moist heat pack or a heating pad. Place a towel between your skin and the heat source. Leave the heat on for 20-30 minutes. If your skin turns bright red, take off the heat right away to prevent burns. The risk of burns is higher if you cannot feel pain, heat, or cold. Raise the injured area above the level of your heart while you are sitting or lying down. Wrap the affected area with an elastic bandage, if told by your doctor. Do not wrap the bandage too tight. If your hematoma is on a leg or foot and is painful, your doctor may give you crutches. Use them as told by your doctor. General instructions Take over-the-counter and prescription medicines only as told by your doctor. Keep all follow-up visits. Your doctor may want to see how your hematoma is healing with treatment. Contact a doctor if: You have a fever. The swelling or bruising gets worse. You start to get more hematomas. Your pain gets worse. Your pain is not getting better  with medicine. The skin over the hematoma breaks or starts to bleed. Get help right away if: Your hematoma is in your chest or belly and you: Pass out. Feel  weak. Become short of breath. You have a hematoma on your scalp that is caused by a fall or injury, and you: Have a headache that gets worse. Have trouble speaking or understanding speech. Become less alert or you pass out. These symptoms may be an emergency. Get help right away. Call 911. Do not wait to see if the symptoms will go away. Do not drive yourself to the hospital This information is not intended to replace advice given to you by your health care provider. Make sure you discuss any questions you have with your health care provider. Document Revised: 12/23/2021 Document Reviewed: 12/23/2021 Elsevier Patient Education  [place], MD [facility]

## 2022-06-04 NOTE — Assessment & Plan Note (Signed)
Well-controlled hypertension. Continue losartan 50 mg daily. Continue daily baby aspirin.

## 2022-06-04 NOTE — Patient Instructions (Signed)
Hematoma A hematoma is a collection of blood. A hematoma can happen: Under the skin. In an organ. In a body space. In a joint space. In other tissues. The blood can thicken (clot) to form a lump that you can see and feel. The lump is often hard and may become sore and tender. The lump can be very small or very big. Most hematomas get better in a few days to weeks. However, some of these may be serious and need medical care. What are the causes? This condition is caused by: An injury. Blood that leaks under the skin. Problems from surgeries. Medical conditions that cause bleeding or bruising. What increases the risk? You are more likely to develop this condition if: You are an older adult. You use medicines that thin your blood. You use NSAIDs, such as ibuprofen, often for pain. You play contact sports. What are the signs or symptoms? Symptoms depend on where the hematoma is in your body. If the hematoma is under the skin, there is: A firm lump on the body. Pain and tenderness in the area. Bruising. The skin above the lump may be blue, dark blue, purple-red, or yellowish. If the hematoma is deep in the tissues or body spaces, there may be: Blood in the stomach. This may cause pain in the belly (abdomen), weakness, passing out (fainting), and shortness of breath. Blood in the head. This may cause a headache, weakness, trouble speaking or understanding speech, or passing out. How is this treated? Treatment depends on the cause, size, and location of the hematoma. Treatment may include: Doing nothing. Many hematomas go away on their own without treatment. Surgery or close monitoring. This may be needed for large hematomas or hematomas that affect the body's organs. Medicines. These may be given if a medical condition caused the hematoma. Follow these instructions at home: Managing pain, stiffness, and swelling  If told, put ice on the injured area. To do this: Put ice in a plastic  bag. Place a towel between your skin and the bag. Leave the ice on for 20 minutes, 2-3 times a day for the first two days. If your skin turns bright red, take off the ice right away to prevent skin damage. The risk of skin damage is higher if you cannot feel pain, heat, or cold. If told, put heat on the injured area. Do this as often as told by your doctor. Use the heat source that your doctor recommends, such as a moist heat pack or a heating pad. Place a towel between your skin and the heat source. Leave the heat on for 20-30 minutes. If your skin turns bright red, take off the heat right away to prevent burns. The risk of burns is higher if you cannot feel pain, heat, or cold. Raise the injured area above the level of your heart while you are sitting or lying down. Wrap the affected area with an elastic bandage, if told by your doctor. Do not wrap the bandage too tight. If your hematoma is on a leg or foot and is painful, your doctor may give you crutches. Use them as told by your doctor. General instructions Take over-the-counter and prescription medicines only as told by your doctor. Keep all follow-up visits. Your doctor may want to see how your hematoma is healing with treatment. Contact a doctor if: You have a fever. The swelling or bruising gets worse. You start to get more hematomas. Your pain gets worse. Your pain is not getting better  with medicine. The skin over the hematoma breaks or starts to bleed. Get help right away if: Your hematoma is in your chest or belly and you: Pass out. Feel weak. Become short of breath. You have a hematoma on your scalp that is caused by a fall or injury, and you: Have a headache that gets worse. Have trouble speaking or understanding speech. Become less alert or you pass out. These symptoms may be an emergency. Get help right away. Call 911. Do not wait to see if the symptoms will go away. Do not drive yourself to the hospital This  information is not intended to replace advice given to you by your health care provider. Make sure you discuss any questions you have with your health care provider. Document Revised: 12/23/2021 Document Reviewed: 12/23/2021 Elsevier Patient Education  [place].

## 2022-06-18 ENCOUNTER — Telehealth: Payer: Self-pay | Admitting: Emergency Medicine

## 2022-06-18 DIAGNOSIS — E785 Hyperlipidemia, unspecified: Secondary | ICD-10-CM

## 2022-06-18 DIAGNOSIS — I10 Essential (primary) hypertension: Secondary | ICD-10-CM

## 2022-06-18 MED ORDER — ROSUVASTATIN CALCIUM 10 MG PO TABS: 10.0000 mg | ORAL_TABLET | Freq: Every day | ORAL | 3 refills | Status: DC

## 2022-06-18 MED ORDER — AMLODIPINE BESYLATE 10 MG PO TABS: 10.0000 mg | ORAL_TABLET | Freq: Every day | ORAL | 3 refills | Status: DC

## 2022-06-18 MED ORDER — LOSARTAN POTASSIUM 50 MG PO TABS: 50.0000 mg | ORAL_TABLET | Freq: Every day | ORAL | 1 refills | Status: DC

## 2022-06-18 NOTE — Telephone Encounter (Signed)
Patient needs her amlodopine 10 mg. Refilled - Please send to [facility] at [facility] Last visit:  06/04/2022 Next Visit:  11/17/2022  Phone:  [phone]

## 2022-06-18 NOTE — Telephone Encounter (Signed)
Called patient to inform her that her new rx was sent to pharmacy

## 2022-08-01 ENCOUNTER — Ambulatory Visit
Admission: EM | Admit: 2022-08-01 | Discharge: 2022-08-01 | Disposition: A | Discharge status: Home / Self Care | Payer: No Typology Code available for payment source | Attending: Physician Assistant | Admitting: Physician Assistant

## 2022-08-01 DIAGNOSIS — L0291 Cutaneous abscess, unspecified: Secondary | ICD-10-CM

## 2022-08-01 MED ORDER — DOXYCYCLINE HYCLATE 100 MG PO CAPS: 100.0000 mg | ORAL_CAPSULE | Freq: Two times a day (BID) | ORAL | 0 refills | Status: DC

## 2022-08-01 NOTE — ED Triage Notes (Signed)
Pt presents with 2 abscesses on each side of inner groin area X 4 days.

## 2022-08-01 NOTE — ED Provider Notes (Signed)
[facility]    CSN: [id number] Arrival date & time: 08/01/22  0809      History   Chief Complaint Chief Complaint  Patient presents with   Abscess    HPI Priscilla Lane is a 62 y.o. female.   Patient here today for evaluation of two separate abscesses to her inner groin that she first noticed about 4 days ago. She has one larger abscess to her left posterior inner groin, and another to her right mid groin area. Neither are draining. They are both painful. She has been using warm compresses and warm soaks without resolution. She denies fever. She has not had any nausea or vomiting.   The history is provided by the patient.    Past Medical History:  Diagnosis Date   Allergic rhinitis    pollen   Hypertension, essential    Obesity     Patient Active Problem List   Diagnosis Date Noted   Leg hematoma, left, initial encounter 06/04/2022   Dyslipidemia 05/07/2021   Situational mixed anxiety and depressive disorder 01/31/2021   Hypertension, essential    Class 1 obesity due to excess calories with serious comorbidity and body mass index (BMI) of 33.0 to 33.9 in adult     Past Surgical History:  Procedure Laterality Date   ABDOMINOPLASTY  2003   COLONOSCOPY  06/2014   polyp - colon tissue, mod diverticulosis, rpt 10 yrs ([place])   saline infusion [place]     for postmenopausal bleeding/fibroids/thickened endometrium ([place])    OB History   No obstetric history on file.      Home Medications    Prior to Admission medications   Medication Sig Start Date End Date Taking? Authorizing Provider  doxycycline (VIBRAMYCIN) 100 MG capsule Take 1 capsule (100 mg total) by mouth 2 (two) times daily. 08/01/22  Yes Francene Finders, PA-C  amLODipine (NORVASC) 10 MG tablet Take 1 tablet (10 mg total) by mouth daily. 06/18/22   Horald Pollen, MD  aspirin 81 MG tablet Take 1 tablet (81 mg total) by mouth daily. 08/14/16   Forrest Moron, MD  losartan (COZAAR) 50  MG tablet Take 1 tablet (50 mg total) by mouth daily. 06/18/22   Horald Pollen, MD  rosuvastatin (CRESTOR) 10 MG tablet Take 1 tablet (10 mg total) by mouth daily. 06/18/22   Horald Pollen, MD    Family History Family History  Problem Relation Age of Onset   Diabetes Mother    CAD Mother 58       CABG   Hypertension Mother    Kidney disease Mother        ESRD on HD   Colon polyps Father    Cancer Maternal Grandmother 39       GYN   Ovarian cancer Maternal Grandmother    Cancer Maternal Grandfather        HEENT   Colon cancer Neg Hx    Rectal cancer Neg Hx    Stomach cancer Neg Hx     Social History Social History   Tobacco Use   Smoking status: Never   Smokeless tobacco: Never  Vaping Use   Vaping Use: Never used  Substance Use Topics   Alcohol use: No    Alcohol/week: 0.0 standard drinks of alcohol   Drug use: No     Allergies   Amlodipine, Lisinopril, and Cephalexin   Review of Systems Review of Systems  Constitutional:  Negative for chills and fever.  Eyes:  Negative for discharge and redness.  Respiratory:  Negative for shortness of breath.   Gastrointestinal:  Negative for abdominal pain, nausea and vomiting.  Skin:  Positive for color change.     Physical Exam Triage Vital Signs ED Triage Vitals  Enc Vitals Group     BP      Pulse      Resp      Temp      Temp src      SpO2      Weight      Height      Head Circumference      Peak Flow      Pain Score      Pain Loc      Pain Edu?      Excl. in [place]?    No data found.  Updated Vital Signs BP (!) 165/89 (BP Location: Left Arm)   Pulse 63   Temp 98.2 F (36.8 C) (Oral)   Resp 17   SpO2 97%      Physical Exam Vitals and nursing note reviewed.  Constitutional:      General: She is not in acute distress.    Appearance: Normal appearance. She is not ill-appearing.  HENT:     Head: Normocephalic and atraumatic.  Eyes:     Conjunctiva/sclera: Conjunctivae normal.   Cardiovascular:     Rate and Rhythm: Normal rate.  Pulmonary:     Effort: Pulmonary effort is normal. No respiratory distress.  Skin:    Comments: Approx 1.5 cm abscess to left groin with mild erythema, no drainage, 0.5 cm abscess to right groin.  Neurological:     Mental Status: She is alert.  Psychiatric:        Mood and Affect: Mood normal.        Behavior: Behavior normal.        Thought Content: Thought content normal.      UC Treatments / Results  Labs (all labs ordered are listed, but only abnormal results are displayed) Labs Reviewed - No data to display  EKG   Radiology No results found.  Procedures Procedures (including critical care time)  Medications Ordered in UC Medications - No data to display  Initial Impression / Assessment and Plan / UC Course  I have reviewed the triage vital signs and the nursing notes.  Pertinent labs & imaging results that were available during my care of the patient were reviewed by me and considered in my medical decision making (see chart for details).    Doxycycline prescribed for antibiotic coverage of abscesses. Encouraged warm compresses and soaks and follow up if no gradual improvement or with any further concerns.   Final Clinical Impressions(s) / UC Diagnoses   Final diagnoses:  Abscess   Discharge Instructions   None    ED Prescriptions     Medication Sig Dispense Auth. Provider   doxycycline (VIBRAMYCIN) 100 MG capsule Take 1 capsule (100 mg total) by mouth 2 (two) times daily. 20 capsule Francene Finders, PA-C      PDMP not reviewed this encounter.   Francene Finders, PA-C 08/01/22 [phone]

## 2022-10-30 ENCOUNTER — Ambulatory Visit (INDEPENDENT_AMBULATORY_CARE_PROVIDER_SITE_OTHER): Payer: Self-pay | Admitting: Emergency Medicine

## 2022-10-30 ENCOUNTER — Ambulatory Visit (INDEPENDENT_AMBULATORY_CARE_PROVIDER_SITE_OTHER): Payer: Self-pay

## 2022-10-30 ENCOUNTER — Encounter: Payer: Self-pay | Admitting: Emergency Medicine

## 2022-10-30 VITALS — BP 128/80 | HR 83 | Temp 98.3°F | Ht 63.0 in | Wt 176.0 lb

## 2022-10-30 DIAGNOSIS — M25561 Pain in right knee: Secondary | ICD-10-CM

## 2022-10-30 DIAGNOSIS — Z7689 Persons encountering health services in other specified circumstances: Secondary | ICD-10-CM

## 2022-10-30 MED ORDER — MELOXICAM 15 MG PO TABS
15.0000 mg | ORAL_TABLET | Freq: Every day | ORAL | 0 refills | Status: AC
Start: 2022-10-30 — End: 2022-11-09

## 2022-10-30 NOTE — Patient Instructions (Signed)
Acute Knee Pain, Adult Many things can cause knee pain. Sometimes, knee pain is sudden (acute) and may be caused by damage, swelling, or irritation of the muscles and tissues that support your knee. The pain often goes away on its own with time and rest. If the pain does not go away, tests may be done to find out what is causing the pain. Follow these instructions at home: If you have a knee sleeve or brace:  Wear the knee sleeve or brace as told by your doctor. Take it off only as told by your doctor. Loosen it if your toes: Tingle. Become numb. Turn cold and blue. Keep it clean. If the knee sleeve or brace is not waterproof: Do not let it get wet. Cover it with a watertight covering when you take a bath or shower. Activity Rest your knee. Do not do things that cause pain or make pain worse. Avoid activities where both feet leave the ground at the same time (high-impact activities). Examples are running, jumping rope, and doing jumping jacks. Work with a physical therapist to make a safe exercise program, as told by your doctor. Managing pain, stiffness, and swelling  If told, put ice on the knee. To do this: If you have a removable knee sleeve or brace, take it off as told by your doctor. Put ice in a plastic bag. Place a towel between your skin and the bag. Leave the ice on for 20 minutes, 2-3 times a day. Take off the ice if your skin turns bright red. This is very important. If you cannot feel pain, heat, or cold, you have a greater risk of damage to the area. If told, use an elastic bandage to put pressure (compression) on your injured knee. Raise your knee above the level of your heart while you are sitting or lying down. Sleep with a pillow under your knee. General instructions Take over-the-counter and prescription medicines only as told by your doctor. Do not smoke or use any products that contain nicotine or tobacco. If you need help quitting, ask your doctor. If you are  overweight, work with your doctor and a food expert (dietitian) to set goals to lose weight. Being overweight can make your knee hurt more. Watch for any changes in your symptoms. Keep all follow-up visits. Contact a doctor if: The knee pain does not stop. The knee pain changes or gets worse. You have a fever along with knee pain. Your knee is red or feels warm when you touch it. Your knee gives out or locks up. Get help right away if: Your knee swells, and the swelling gets worse. You cannot move your knee. You have very bad knee pain that does not get better with pain medicine. Summary Many things can cause knee pain. The pain often goes away on its own with time and rest. Your doctor may do tests to find out the cause of the pain. Watch for any changes in your symptoms. Relieve your pain with rest, medicines, light activity, and use of ice. Get help right away if you cannot move your knee or your knee pain is very bad. This information is not intended to replace advice given to you by your health care provider. Make sure you discuss any questions you have with your health care provider. Document Revised: 12/14/2019 Document Reviewed: 12/14/2019 Elsevier Patient Education  2023 Elsevier Inc[employer]  

## 2022-10-30 NOTE — Progress Notes (Signed)
Priscilla Lane 62 y.o.   Chief Complaint  Patient presents with   Knee Pain    Right knee pain for about a week. Pt states having tightness all around the knee and constant aches.     HISTORY OF PRESENT ILLNESS: Acute problem visit today This is a 63 y.o. female complaining of pain to right knee that started about a week ago.  Denies injury.  Denies associated symptoms.  Knee Pain      Prior to Admission medications   Medication Sig Start Date End Date Taking? Authorizing Provider  amLODipine (NORVASC) 10 MG tablet Take 1 tablet (10 mg total) by mouth daily. 06/18/22  Yes SagardiaEilleen Kempf, MD  aspirin 81 MG tablet Take 1 tablet (81 mg total) by mouth daily. 08/14/16  Yes Doristine Bosworth, MD  doxycycline (VIBRAMYCIN) 100 MG capsule Take 1 capsule (100 mg total) by mouth 2 (two) times daily. 08/01/22  Yes Tomi Bamberger, PA-C  losartan (COZAAR) 50 MG tablet Take 1 tablet (50 mg total) by mouth daily. 06/18/22  Yes Acadia Thammavong, Eilleen Kempf, MD  rosuvastatin (CRESTOR) 10 MG tablet Take 1 tablet (10 mg total) by mouth daily. 06/18/22  Yes SagardiaEilleen Kempf, MD    Allergies  Allergen Reactions   Amlodipine     Lower extremity edema   Lisinopril     Cough, muscle cramps   Cephalexin Rash    Taking cephalexin for 2 days and is broken out in a facial rash with swelling of cheeks and eyelids. Incidentally, was also taking some ibuprofen at the same time but she has tolerated that well in the past and it probably is not the allergen.    Patient Active Problem List   Diagnosis Date Noted   Leg hematoma, left, initial encounter 06/04/2022   Dyslipidemia 05/07/2021   Situational mixed anxiety and depressive disorder 01/31/2021   Hypertension, essential    Class 1 obesity due to excess calories with serious comorbidity and body mass index (BMI) of 33.0 to 33.9 in adult     Past Medical History:  Diagnosis Date   Allergic rhinitis    pollen   Hypertension, essential    Obesity      Past Surgical History:  Procedure Laterality Date   ABDOMINOPLASTY  2003   COLONOSCOPY  06/2014   polyp - colon tissue, mod diverticulosis, rpt 10 yrs ([place])   saline infusion [place]     for postmenopausal bleeding/fibroids/thickened endometrium Environmental consultant)    Social History   Socioeconomic History   Marital status: Married    Spouse name: Not on file   Number of children: 3   Years of education: Not on file   Highest education level: High school graduate  Occupational History   Not on file  Tobacco Use   Smoking status: Never   Smokeless tobacco: Never  Vaping Use   Vaping Use: Never used  Substance and Sexual Activity   Alcohol use: No    Alcohol/week: 0.0 standard drinks of alcohol   Drug use: No   Sexual activity: Not Currently  Other Topics Concern   Not on file  Social History Narrative   Lives with husband, 1 dog   Grown children, 3 grandchildren   Occupation: homemaker, 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   Edu: HS   Activity: 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   Diet: good water, fruits/vegetables daily   Social Determinants of Health   Financial Resource Strain: Not on file  Food Insecurity: No Food Insecurity (02/14/2021)  Hunger Vital Sign    Worried About Running Out of Food in the Last Year: Never true    Ran Out of Food in the Last Year: Never true  Transportation Needs: No Transportation Needs (02/14/2021)   PRAPARE - Administrator, Civil Service (Medical): No    Lack of Transportation (Non-Medical): No  Physical Activity: Not on file  Stress: Not on file  Social Connections: Not on file  Intimate Partner Violence: Not on file    Family History  Problem Relation Age of Onset   Diabetes Mother    CAD Mother 67       CABG   Hypertension Mother    Kidney disease Mother        ESRD on HD   Colon polyps Father    Cancer Maternal Grandmother 56       GYN   Ovarian cancer Maternal Grandmother    Cancer Maternal Grandfather        HEENT   Colon cancer  Neg Hx    Rectal cancer Neg Hx    Stomach cancer Neg Hx      Review of Systems  Constitutional: Negative.  Negative for chills and fever.  HENT: Negative.  Negative for congestion and sore throat.   Respiratory: Negative.  Negative for cough and shortness of breath.   Cardiovascular: Negative.  Negative for chest pain and palpitations.  Gastrointestinal:  Negative for abdominal pain, nausea and vomiting.  Musculoskeletal:  Positive for joint pain (Right knee pain).  Skin: Negative.  Negative for rash.  Neurological: Negative.  Negative for dizziness and headaches.  All other systems reviewed and are negative.   Vitals:   10/30/22 1554  BP: 128/80  Pulse: 83  Temp: 98.3 F (36.8 C)  SpO2: 93%    Physical Exam Vitals reviewed.  Constitutional:      Appearance: Normal appearance.  HENT:     Head: Normocephalic.  Eyes:     Extraocular Movements: Extraocular movements intact.  Cardiovascular:     Rate and Rhythm: Normal rate.  Pulmonary:     Effort: Pulmonary effort is normal.  Musculoskeletal:     Comments: Right knee: No significant swelling.  No tenderness.  Some crepitation during motion Stable in flexion and extension  Skin:    General: Skin is warm and dry.  Neurological:     Mental Status: She is alert and oriented to person, place, and time.  Psychiatric:        Mood and Affect: Mood normal.        Behavior: Behavior normal.      ASSESSMENT & PLAN: A total of 33 minutes was spent with the patient and counseling/coordination of care regarding preparing for this visit, review of most recent office visit notes, review of chronic medical conditions under management, review of all medications, differential diagnosis of knee pain and need for workup including x-ray, pain management, need for orthopedic evaluation, prognosis, documentation, need for follow-up.  Problem List Items Addressed This Visit       Other   Acute pain of right knee - Primary    Stable  physical exam Differential diagnosis discussed X-ray done today.  Report reviewed. Pain management discussed Recommend to start meloxicam 15 mg daily for 10 days Recommend orthopedic evaluation Referral placed today      Relevant Medications   meloxicam (MOBIC) 15 MG tablet   Other Relevant Orders   Ambulatory referral to Sports Medicine   DG Knee Complete 4  Views Right   Other Visit Diagnoses     Encounter to establish care       Relevant Orders   Ambulatory referral to Gynecology      Patient Instructions  Acute Knee Pain, Adult Many things can cause knee pain. Sometimes, knee pain is sudden (acute) and may be caused by damage, swelling, or irritation of the muscles and tissues that support your knee. The pain often goes away on its own with time and rest. If the pain does not go away, tests may be done to find out what is causing the pain. Follow these instructions at home: If you have a knee sleeve or brace:  Wear the knee sleeve or brace as told by your doctor. Take it off only as told by your doctor. Loosen it if your toes: Tingle. Become numb. Turn cold and blue. Keep it clean. If the knee sleeve or brace is not waterproof: Do not let it get wet. Cover it with a watertight covering when you take a bath or shower. Activity Rest your knee. Do not do things that cause pain or make pain worse. Avoid activities where both feet leave the ground at the same time (high-impact activities). Examples are running, jumping rope, and doing jumping jacks. Work with a physical therapist to make a safe exercise program, as told by your doctor. Managing pain, stiffness, and swelling  If told, put ice on the knee. To do this: If you have a removable knee sleeve or brace, take it off as told by your doctor. Put ice in a plastic bag. Place a towel between your skin and the bag. Leave the ice on for 20 minutes, 2-3 times a day. Take off the ice if your skin turns bright red. This  is very important. If you cannot feel pain, heat, or cold, you have a greater risk of damage to the area. If told, use an elastic bandage to put pressure (compression) on your injured knee. Raise your knee above the level of your heart while you are sitting or lying down. Sleep with a pillow under your knee. General instructions Take over-the-counter and prescription medicines only as told by your doctor. Do not smoke or use any products that contain nicotine or tobacco. If you need help quitting, ask your doctor. If you are overweight, work with your doctor and a food expert (dietitian) to set goals to lose weight. Being overweight can make your knee hurt more. Watch for any changes in your symptoms. Keep all follow-up visits. Contact a doctor if: The knee pain does not stop. The knee pain changes or gets worse. You have a fever along with knee pain. Your knee is red or feels warm when you touch it. Your knee gives out or locks up. Get help right away if: Your knee swells, and the swelling gets worse. You cannot move your knee. You have very bad knee pain that does not get better with pain medicine. Summary Many things can cause knee pain. The pain often goes away on its own with time and rest. Your doctor may do tests to find out the cause of the pain. Watch for any changes in your symptoms. Relieve your pain with rest, medicines, light activity, and use of ice. Get help right away if you cannot move your knee or your knee pain is very bad. This information is not intended to replace advice given to you by your health care provider. Make sure you discuss any questions you have  with your health care provider. Document Revised: 12/14/2019 Document Reviewed: 12/14/2019 Elsevier Patient Education  2023 Else[employer].    Edwina Barth, MD [facility]

## 2022-10-30 NOTE — Assessment & Plan Note (Signed)
Stable physical exam Differential diagnosis discussed X-ray done today.  Report reviewed. Pain management discussed Recommend to start meloxicam 15 mg daily for 10 days Recommend orthopedic evaluation Referral placed today

## 2022-11-03 NOTE — Progress Notes (Unsigned)
   Rubin Payor, PhD, LAT, ATC acting as a scribe for Clementeen Graham, MD.  Subjective:    CC: R knee pain  HPI: Pt is a 62 y/o female c/o R knee pain x 2 wks. No MOI. Pt c/o a constant "ache" in her R knee. Pt locates pain to ***  R Knee swelling: Mechanical symptoms: Aggravates: Treatments tried:  Dx imaging: 10/30/22 R knee XR  Pertinent review of Systems: ***  Relevant historical information: ***   Objective:   There were no vitals filed for this visit. General: Well Developed, well nourished, and in no acute distress.   MSK: ***  Lab and Radiology Results No results found for this or any previous visit (from the past 72 hour(s)). DG Knee Complete 4 Views Right  Result Date: 10/30/2022 CLINICAL DATA:  Right knee pain EXAM: RIGHT KNEE - COMPLETE 4 VIEW COMPARISON:  None Available. FINDINGS: No acute fracture or dislocation. Degenerative joint space narrowing, most prominent in the lateral compartment, where there is spurring. Joint effusion. Patellar enthesophyte. IMPRESSION: Degenerative changes, most prominent in the lateral compartment. No acute osseous abnormality. Electronically Signed   By: Wiliam Ke M.D.   On: 10/30/2022 16:46      Impression and Recommendations:    Assessment and Plan: 62 y.o. female with ***.  PDMP not reviewed this encounter. No orders of the defined types were placed in this encounter.  No orders of the defined types were placed in this encounter.   Discussed warning signs or symptoms. Please see discharge instructions. Patient expresses understanding.   ***

## 2022-11-04 ENCOUNTER — Ambulatory Visit: Payer: Self-pay | Admitting: Family Medicine

## 2022-11-04 ENCOUNTER — Ambulatory Visit: Payer: Self-pay

## 2022-11-04 VITALS — BP 170/90 | HR 75 | Ht 63.0 in | Wt 180.0 lb

## 2022-11-04 DIAGNOSIS — M1711 Unilateral primary osteoarthritis, right knee: Secondary | ICD-10-CM

## 2022-11-04 DIAGNOSIS — M25561 Pain in right knee: Secondary | ICD-10-CM

## 2022-11-04 NOTE — Patient Instructions (Addendum)
Thank you for coming in today.   Please use Voltaren gel (Generic Diclofenac Gel) up to 4x daily for pain as needed.  This is available over-the-counter as both the name brand Voltaren gel and the generic diclofenac gel.   Tylenol arthritis (generic 650 extended release) up to 2 pills every 8 hours.   Ok to use meloxicam as needed after you finish your course of medicine. Priscilla Lane is that you don't take every day.  Let me know.   If not better you can return for just an injection at any time.   Work on Systems developer.

## 2022-11-19 ENCOUNTER — Ambulatory Visit: Payer: Self-pay | Admitting: Emergency Medicine

## 2022-11-19 ENCOUNTER — Ambulatory Visit (INDEPENDENT_AMBULATORY_CARE_PROVIDER_SITE_OTHER): Payer: Self-pay | Admitting: Emergency Medicine

## 2022-11-19 ENCOUNTER — Encounter: Payer: Self-pay | Admitting: Emergency Medicine

## 2022-11-19 VITALS — BP 130/70 | HR 100 | Temp 98.3°F | Ht 63.0 in | Wt 174.1 lb

## 2022-11-19 DIAGNOSIS — E785 Hyperlipidemia, unspecified: Secondary | ICD-10-CM

## 2022-11-19 DIAGNOSIS — I10 Essential (primary) hypertension: Secondary | ICD-10-CM

## 2022-11-19 DIAGNOSIS — Z6833 Body mass index (BMI) 33.0-33.9, adult: Secondary | ICD-10-CM

## 2022-11-19 DIAGNOSIS — E6609 Other obesity due to excess calories: Secondary | ICD-10-CM

## 2022-11-19 LAB — COMPREHENSIVE METABOLIC PANEL
ALT: 19 U/L (ref 0–35)
AST: 20 U/L (ref 0–37)
Albumin: 4.6 g/dL (ref 3.5–5.2)
Alkaline Phosphatase: 68 U/L (ref 39–117)
BUN: 16 mg/dL (ref 6–23)
CO2: 27 mEq/L (ref 19–32)
Calcium: 10.1 mg/dL (ref 8.4–10.5)
Chloride: 103 mEq/L (ref 96–112)
Creatinine, Ser: 0.86 mg/dL (ref 0.40–1.20)
GFR: 72.09 mL/min (ref >60.00)
Glucose, Bld: 82 mg/dL (ref 70–99)
Potassium: 4 mEq/L (ref 3.5–5.1)
Sodium: 140 mEq/L (ref 135–145)
Total Bilirubin: 0.5 mg/dL (ref 0.2–1.2)
Total Protein: 7.7 g/dL (ref 6.0–8.3)

## 2022-11-19 LAB — CBC WITH DIFFERENTIAL/PLATELET
Basophils Absolute: 0 10*3/uL (ref 0.0–0.1)
Basophils Relative: 0.7 % (ref 0.0–3.0)
Eosinophils Absolute: 0.1 10*3/uL (ref 0.0–0.7)
Eosinophils Relative: 1.4 % (ref 0.0–5.0)
HCT: 38.2 % (ref 36.0–46.0)
Hemoglobin: 12.8 g/dL (ref 12.0–15.0)
Lymphocytes Relative: 33.6 % (ref 12.0–46.0)
Lymphs Abs: 1.8 10*3/uL (ref 0.7–4.0)
MCHC: 33.5 g/dL (ref 30.0–36.0)
MCV: 92.1 fl (ref 78.0–100.0)
Monocytes Absolute: 0.5 10*3/uL (ref 0.1–1.0)
Monocytes Relative: 8.7 % (ref 3.0–12.0)
Neutro Abs: 3 10*3/uL (ref 1.4–7.7)
Neutrophils Relative %: 55.6 % (ref 43.0–77.0)
Platelets: 220 10*3/uL (ref 150.0–400.0)
RBC: 4.15 Mil/uL (ref 3.87–5.11)
RDW: 13.6 % (ref 11.5–15.5)
WBC: 5.4 10*3/uL (ref 4.0–10.5)

## 2022-11-19 LAB — LIPID PANEL
Cholesterol: 217 mg/dL — ABNORMAL HIGH (ref 0–200)
HDL: 59.2 mg/dL (ref >39.00)
LDL Cholesterol: 139 mg/dL — ABNORMAL HIGH (ref 0–99)
NonHDL: 157.52
Total CHOL/HDL Ratio: 4
Triglycerides: 92 mg/dL (ref 0.0–149.0)
VLDL: 18.4 mg/dL (ref 0.0–40.0)

## 2022-11-19 LAB — HEMOGLOBIN A1C: Hgb A1c MFr Bld: 5.7 % (ref 4.6–6.5)

## 2022-11-19 NOTE — Assessment & Plan Note (Signed)
Chronic stable condition Lipid profile done today Continue rosuvastatin 10 mg daily Diet and nutrition discussed Benefits of exercise discussed

## 2022-11-19 NOTE — Progress Notes (Signed)
Priscilla Lane 62 y.o.   Chief Complaint  Patient presents with   Medical Management of Chronic Issues    f/u appt, no concerns     HISTORY OF PRESENT ILLNESS: This is a 62 y.o. female [name] here for 85-month follow-up of chronic medical issues Overall doing well. Recently seen by me for right knee pain.  X-ray showed degenerative changes.  Was seen by sports medicine.  Doing much better. No other complaints or medical concerns today.   HPI   Prior to Admission medications   Medication Sig Start Date End Date Taking? Authorizing Provider  amLODipine (NORVASC) 10 MG tablet Take 1 tablet (10 mg total) by mouth daily. 06/18/22  Yes SagardiaEilleen Kempf, MD  aspirin 81 MG tablet Take 1 tablet (81 mg total) by mouth daily. 08/14/16  Yes Collie Siad A, MD  losartan (COZAAR) 50 MG tablet Take 1 tablet (50 mg total) by mouth daily. 06/18/22  Yes Cimberly Stoffel, Eilleen Kempf, MD  rosuvastatin (CRESTOR) 10 MG tablet Take 1 tablet (10 mg total) by mouth daily. 06/18/22  Yes SagardiaEilleen Kempf, MD    Allergies  Allergen Reactions   Amlodipine     Lower extremity edema   Lisinopril     Cough, muscle cramps   Cephalexin Rash    Taking cephalexin for 2 days and is broken out in a facial rash with swelling of cheeks and eyelids. Incidentally, was also taking some ibuprofen at the same time but she has tolerated that well in the past and it probably is not the allergen.    Patient Active Problem List   Diagnosis Date Noted   Acute pain of right knee 10/30/2022   Leg hematoma, left, initial encounter 06/04/2022   Dyslipidemia 05/07/2021   Situational mixed anxiety and depressive disorder 01/31/2021   Hypertension, essential    Class 1 obesity due to excess calories with serious comorbidity and body mass index (BMI) of 33.0 to 33.9 in adult     Past Medical History:  Diagnosis Date   Allergic rhinitis    pollen   Hypertension, essential    Obesity     Past Surgical History:   Procedure Laterality Date   ABDOMINOPLASTY  2003   COLONOSCOPY  06/2014   polyp - colon tissue, mod diverticulosis, rpt 10 yrs ([place])   saline infusion [place]     for postmenopausal bleeding/fibroids/thickened endometrium Environmental consultant)    Social History   Socioeconomic History   Marital status: Married    Spouse name: Not on file   Number of children: 3   Years of education: Not on file   Highest education level: High school graduate  Occupational History   Not on file  Tobacco Use   Smoking status: Never   Smokeless tobacco: Never  Vaping Use   Vaping Use: Never used  Substance and Sexual Activity   Alcohol use: No    Alcohol/week: 0.0 standard drinks of alcohol   Drug use: No   Sexual activity: Not Currently  Other Topics Concern   Not on file  Social History Narrative   Lives with husband, 1 dog   Grown children, 3 grandchildren   Occupation: homemaker, 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   Edu: HS   Activity: 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   Diet: good water, fruits/vegetables daily   Social Determinants of Health   Financial Resource Strain: Not on file  Food Insecurity: No Food Insecurity (02/14/2021)   Hunger Vital Sign    Worried About Running Out of  Food in the Last Year: Never true    Ran Out of Food in the Last Year: Never true  Transportation Needs: No Transportation Needs (02/14/2021)   PRAPARE - Administrator, Civil Service (Medical): No    Lack of Transportation (Non-Medical): No  Physical Activity: Not on file  Stress: Not on file  Social Connections: Not on file  Intimate Partner Violence: Not on file    Family History  Problem Relation Age of Onset   Diabetes Mother    CAD Mother 40       CABG   Hypertension Mother    Kidney disease Mother        ESRD on HD   Colon polyps Father    Cancer Maternal Grandmother 15       GYN   Ovarian cancer Maternal Grandmother    Cancer Maternal Grandfather        HEENT   Colon cancer Neg Hx    Rectal cancer Neg  Hx    Stomach cancer Neg Hx      Review of Systems  Constitutional: Negative.  Negative for chills and fever.  HENT: Negative.  Negative for congestion and sore throat.   Respiratory: Negative.  Negative for cough and shortness of breath.   Cardiovascular: Negative.  Negative for chest pain and palpitations.  Gastrointestinal:  Negative for abdominal pain, diarrhea, nausea and vomiting.  Genitourinary: Negative.  Negative for dysuria and hematuria.  Skin: Negative.  Negative for rash.  Neurological: Negative.  Negative for dizziness and headaches.  All other systems reviewed and are negative.   Vitals:   11/19/22 1515  BP: (!) 158/84  Pulse: 100  Temp: 98.3 F (36.8 C)  SpO2: 94%    Physical Exam Vitals reviewed.  Constitutional:      Appearance: Normal appearance.  HENT:     Head: Normocephalic.     Mouth/Throat:     Mouth: Mucous membranes are moist.     Pharynx: Oropharynx is clear.  Eyes:     Extraocular Movements: Extraocular movements intact.     Pupils: Pupils are equal, round, and reactive to light.  Cardiovascular:     Rate and Rhythm: Normal rate and regular rhythm.     Pulses: Normal pulses.     Heart sounds: Normal heart sounds.  Pulmonary:     Effort: Pulmonary effort is normal.     Breath sounds: Normal breath sounds.  Abdominal:     Palpations: Abdomen is soft.     Tenderness: There is no abdominal tenderness.  Musculoskeletal:     Cervical back: No tenderness.  Lymphadenopathy:     Cervical: No cervical adenopathy.  Skin:    General: Skin is warm and dry.  Neurological:     General: No focal deficit present.     Mental Status: She is alert and oriented to person, place, and time.  Psychiatric:        Mood and Affect: Mood normal.        Behavior: Behavior normal.      ASSESSMENT & PLAN: A total of 45 minutes was spent with the patient and counseling/coordination of care regarding preparing for this visit, review of most recent office  visit notes, review of multiple chronic medical conditions and their management, review of all medications, review of most recent blood work results, review of health maintenance items, education on nutrition, prognosis, documentation, and need for follow-up.  Problem List Items Addressed This Visit  Cardiovascular and Mediastinum   Hypertension, essential - Primary    Elevated blood pressure reading in the office today but normal at home Continue amlodipine 10 mg and losartan 50 mg daily Cardiovascular risks associated with uncontrolled hypertension discussed Dietary approaches to stop hypertension discussed Blood work done today Follow-up in 6 months      Relevant Orders   CBC with Differential/Platelet   Comprehensive metabolic panel   Hemoglobin A1c   Lipid panel     Other   Class 1 obesity due to excess calories with serious comorbidity and body mass index (BMI) of 33.0 to 33.9 in adult    Diet and nutrition discussed Benefits of exercise discussed Advised to decrease amount of daily carbohydrate intake and daily calories and increase amount of plant-based protein in her diet      Relevant Orders   CBC with Differential/Platelet   Comprehensive metabolic panel   Hemoglobin A1c   Lipid panel   Dyslipidemia    Chronic stable condition Lipid profile done today Continue rosuvastatin 10 mg daily Diet and nutrition discussed Benefits of exercise discussed      Relevant Orders   CBC with Differential/Platelet   Comprehensive metabolic panel   Hemoglobin A1c   Lipid panel   Patient Instructions  Hypertension, Adult High blood pressure (hypertension) is when the force of blood pumping through the arteries is too strong. The arteries are the blood vessels that carry blood from the heart throughout the body. Hypertension forces the heart to work harder to pump blood and may cause arteries to become narrow or stiff. Untreated or uncontrolled hypertension can lead to a  heart attack, heart failure, a stroke, kidney disease, and other problems. A blood pressure reading consists of a higher number over a lower number. Ideally, your blood pressure should be below 120/80. The first ("top") number is called the systolic pressure. It is a measure of the pressure in your arteries as your heart beats. The second ("bottom") number is called the diastolic pressure. It is a measure of the pressure in your arteries as the heart relaxes. What are the causes? The exact cause of this condition is not known. There are some conditions that result in high blood pressure. What increases the risk? Certain factors may make you more likely to develop high blood pressure. Some of these risk factors are under your control, including: Smoking. Not getting enough exercise or physical activity. Being overweight. Having too much fat, sugar, calories, or salt (sodium) in your diet. Drinking too much alcohol. Other risk factors include: Having a personal history of heart disease, diabetes, high cholesterol, or kidney disease. Stress. Having a family history of high blood pressure and high cholesterol. Having obstructive sleep apnea. Age. The risk increases with age. What are the signs or symptoms? High blood pressure may not cause symptoms. Very high blood pressure (hypertensive crisis) may cause: Headache. Fast or irregular heartbeats (palpitations). Shortness of breath. Nosebleed. Nausea and vomiting. Vision changes. Severe chest pain, dizziness, and seizures. How is this diagnosed? This condition is diagnosed by measuring your blood pressure while you are seated, with your arm resting on a flat surface, your legs uncrossed, and your feet flat on the floor. The cuff of the blood pressure monitor will be placed directly against the skin of your upper arm at the level of your heart. Blood pressure should be measured at least twice using the same arm. Certain conditions can cause a  difference in blood pressure between your right and  left arms. If you have a high blood pressure reading during one visit or you have normal blood pressure with other risk factors, you may be asked to: Return on a different day to have your blood pressure checked again. Monitor your blood pressure at home for 1 week or longer. If you are diagnosed with hypertension, you may have other blood or imaging tests to help your health care provider understand your overall risk for other conditions. How is this treated? This condition is treated by making healthy lifestyle changes, such as eating healthy foods, exercising more, and reducing your alcohol intake. You may be referred for counseling on a healthy diet and physical activity. Your health care provider may prescribe medicine if lifestyle changes are not enough to get your blood pressure under control and if: Your systolic blood pressure is above 130. Your diastolic blood pressure is above 80. Your personal target blood pressure may vary depending on your medical conditions, your age, and other factors. Follow these instructions at home: Eating and drinking  Eat a diet that is high in fiber and potassium, and low in sodium, added sugar, and fat. An example of this eating plan is called the DASH diet. DASH stands for Dietary Approaches to Stop Hypertension. To eat this way: Eat plenty of fresh fruits and vegetables. Try to fill one half of your plate at each meal with fruits and vegetables. Eat whole grains, such as whole-wheat pasta, brown rice, or whole-grain bread. Fill about one fourth of your plate with whole grains. Eat or drink low-fat dairy products, such as skim milk or low-fat yogurt. Avoid fatty cuts of meat, processed or cured meats, and poultry with skin. Fill about one fourth of your plate with lean proteins, such as fish, chicken without skin, beans, eggs, or tofu. Avoid pre-made and processed foods. These tend to be higher in sodium,  added sugar, and fat. Reduce your daily sodium intake. Many people with hypertension should eat less than 1,500 mg of sodium a day. Do not drink alcohol if: Your health care provider tells you not to drink. You are pregnant, may be pregnant, or are planning to become pregnant. If you drink alcohol: Limit how much you have to: 0-1 drink a day for women. 0-2 drinks a day for men. Know how much alcohol is in your drink. In the [place] one drink equals one 12 oz bottle of beer (355 mL), one 5 oz glass of wine (148 mL), or one 1 oz glass of hard liquor (44 mL). Lifestyle  Work with your health care provider to maintain a healthy body weight or to lose weight. Ask what an ideal weight is for you. Get at least 30 minutes of exercise that causes your heart to beat faster (aerobic exercise) most days of the week. Activities may include walking, swimming, or biking. Include exercise to strengthen your muscles (resistance exercise), such as Pilates or lifting weights, as part of your weekly exercise routine. Try to do these types of exercises for 30 minutes at least 3 days a week. Do not use any products that contain nicotine or tobacco. These products include cigarettes, chewing tobacco, and vaping devices, such as e-cigarettes. If you need help quitting, ask your health care provider. Monitor your blood pressure at home as told by your health care provider. Keep all follow-up visits. This is important. Medicines Take over-the-counter and prescription medicines only as told by your health care provider. Follow directions carefully. Blood pressure medicines must be taken as  prescribed. Do not skip doses of blood pressure medicine. Doing this puts you at risk for problems and can make the medicine less effective. Ask your health care provider about side effects or reactions to medicines that you should watch for. Contact a health care provider if you: Think you are having a reaction to a medicine you are  taking. Have headaches that keep coming back (recurring). Feel dizzy. Have swelling in your ankles. Have trouble with your vision. Get help right away if you: Develop a severe headache or confusion. Have unusual weakness or numbness. Feel faint. Have severe pain in your chest or abdomen. Vomit repeatedly. Have trouble breathing. These symptoms may be an emergency. Get help right away. Call 911. Do not wait to see if the symptoms will go away. Do not drive yourself to the hospital. Summary Hypertension is when the force of blood pumping through your arteries is too strong. If this condition is not controlled, it may put you at risk for serious complications. Your personal target blood pressure may vary depending on your medical conditions, your age, and other factors. For most people, a normal blood pressure is less than 120/80. Hypertension is treated with lifestyle changes, medicines, or a combination of both. Lifestyle changes include losing weight, eating a healthy, low-sodium diet, exercising more, and limiting alcohol. This information is not intended to replace advice given to you by your health care provider. Make sure you discuss any questions you have with your health care provider. Document Revised: 05/07/2021 Document Reviewed: 05/07/2021 Elsevier Patient Education  2023 Elsevier Inc.    Edwina Barth, MD [facility]

## 2022-11-19 NOTE — Patient Instructions (Signed)
Hypertension, Adult High blood pressure (hypertension) is when the force of blood pumping through the arteries is too strong. The arteries are the blood vessels that carry blood from the heart throughout the body. Hypertension forces the heart to work harder to pump blood and may cause arteries to become narrow or stiff. Untreated or uncontrolled hypertension can lead to a heart attack, heart failure, a stroke, kidney disease, and other problems. A blood pressure reading consists of a higher number over a lower number. Ideally, your blood pressure should be below 120/80. The first ("top") number is called the systolic pressure. It is a measure of the pressure in your arteries as your heart beats. The second ("bottom") number is called the diastolic pressure. It is a measure of the pressure in your arteries as the heart relaxes. What are the causes? The exact cause of this condition is not known. There are some conditions that result in high blood pressure. What increases the risk? Certain factors may make you more likely to develop high blood pressure. Some of these risk factors are under your control, including: Smoking. Not getting enough exercise or physical activity. Being overweight. Having too much fat, sugar, calories, or salt (sodium) in your diet. Drinking too much alcohol. Other risk factors include: Having a personal history of heart disease, diabetes, high cholesterol, or kidney disease. Stress. Having a family history of high blood pressure and high cholesterol. Having obstructive sleep apnea. Age. The risk increases with age. What are the signs or symptoms? High blood pressure may not cause symptoms. Very high blood pressure (hypertensive crisis) may cause: Headache. Fast or irregular heartbeats (palpitations). Shortness of breath. Nosebleed. Nausea and vomiting. Vision changes. Severe chest pain, dizziness, and seizures. How is this diagnosed? This condition is diagnosed by  measuring your blood pressure while you are seated, with your arm resting on a flat surface, your legs uncrossed, and your feet flat on the floor. The cuff of the blood pressure monitor will be placed directly against the skin of your upper arm at the level of your heart. Blood pressure should be measured at least twice using the same arm. Certain conditions can cause a difference in blood pressure between your right and left arms. If you have a high blood pressure reading during one visit or you have normal blood pressure with other risk factors, you may be asked to: Return on a different day to have your blood pressure checked again. Monitor your blood pressure at home for 1 week or longer. If you are diagnosed with hypertension, you may have other blood or imaging tests to help your health care provider understand your overall risk for other conditions. How is this treated? This condition is treated by making healthy lifestyle changes, such as eating healthy foods, exercising more, and reducing your alcohol intake. You may be referred for counseling on a healthy diet and physical activity. Your health care provider may prescribe medicine if lifestyle changes are not enough to get your blood pressure under control and if: Your systolic blood pressure is above 130. Your diastolic blood pressure is above 80. Your personal target blood pressure may vary depending on your medical conditions, your age, and other factors. Follow these instructions at home: Eating and drinking  Eat a diet that is high in fiber and potassium, and low in sodium, added sugar, and fat. An example of this eating plan is called the DASH diet. DASH stands for Dietary Approaches to Stop Hypertension. To eat this way: Eat   plenty of fresh fruits and vegetables. Try to fill one half of your plate at each meal with fruits and vegetables. Eat whole grains, such as whole-wheat pasta, brown rice, or whole-grain bread. Fill about one  fourth of your plate with whole grains. Eat or drink low-fat dairy products, such as skim milk or low-fat yogurt. Avoid fatty cuts of meat, processed or cured meats, and poultry with skin. Fill about one fourth of your plate with lean proteins, such as fish, chicken without skin, beans, eggs, or tofu. Avoid pre-made and processed foods. These tend to be higher in sodium, added sugar, and fat. Reduce your daily sodium intake. Many people with hypertension should eat less than 1,500 mg of sodium a day. Do not drink alcohol if: Your health care provider tells you not to drink. You are pregnant, may be pregnant, or are planning to become pregnant. If you drink alcohol: Limit how much you have to: 0-1 drink a day for women. 0-2 drinks a day for men. Know how much alcohol is in your drink. In the [place] one drink equals one 12 oz bottle of beer (355 mL), one 5 oz glass of wine (148 mL), or one 1 oz glass of hard liquor (44 mL). Lifestyle  Work with your health care provider to maintain a healthy body weight or to lose weight. Ask what an ideal weight is for you. Get at least 30 minutes of exercise that causes your heart to beat faster (aerobic exercise) most days of the week. Activities may include walking, swimming, or biking. Include exercise to strengthen your muscles (resistance exercise), such as Pilates or lifting weights, as part of your weekly exercise routine. Try to do these types of exercises for 30 minutes at least 3 days a week. Do not use any products that contain nicotine or tobacco. These products include cigarettes, chewing tobacco, and vaping devices, such as e-cigarettes. If you need help quitting, ask your health care provider. Monitor your blood pressure at home as told by your health care provider. Keep all follow-up visits. This is important. Medicines Take over-the-counter and prescription medicines only as told by your health care provider. Follow directions carefully. Blood  pressure medicines must be taken as prescribed. Do not skip doses of blood pressure medicine. Doing this puts you at risk for problems and can make the medicine less effective. Ask your health care provider about side effects or reactions to medicines that you should watch for. Contact a health care provider if you: Think you are having a reaction to a medicine you are taking. Have headaches that keep coming back (recurring). Feel dizzy. Have swelling in your ankles. Have trouble with your vision. Get help right away if you: Develop a severe headache or confusion. Have unusual weakness or numbness. Feel faint. Have severe pain in your chest or abdomen. Vomit repeatedly. Have trouble breathing. These symptoms may be an emergency. Get help right away. Call 911. Do not wait to see if the symptoms will go away. Do not drive yourself to the hospital. Summary Hypertension is when the force of blood pumping through your arteries is too strong. If this condition is not controlled, it may put you at risk for serious complications. Your personal target blood pressure may vary depending on your medical conditions, your age, and other factors. For most people, a normal blood pressure is less than 120/80. Hypertension is treated with lifestyle changes, medicines, or a combination of both. Lifestyle changes include losing weight, eating a healthy,   low-sodium diet, exercising more, and limiting alcohol. This information is not intended to replace advice given to you by your health care provider. Make sure you discuss any questions you have with your health care provider. Document Revised: 05/07/2021 Document Reviewed: 05/07/2021 Elsevier Patient Education  2023 Elsevier Inc.  

## 2022-11-19 NOTE — Assessment & Plan Note (Signed)
Elevated blood pressure reading in the office today but normal at home Continue amlodipine 10 mg and losartan 50 mg daily Cardiovascular risks associated with uncontrolled hypertension discussed Dietary approaches to stop hypertension discussed Blood work done today Follow-up in 6 months

## 2022-11-19 NOTE — Assessment & Plan Note (Signed)
Diet and nutrition discussed Benefits of exercise discussed Advised to decrease amount of daily carbohydrate intake and daily calories and increase amount of plant based protein in her diet. 

## 2022-11-20 ENCOUNTER — Other Ambulatory Visit: Payer: Self-pay | Admitting: Emergency Medicine

## 2022-11-20 MED ORDER — ROSUVASTATIN CALCIUM 20 MG PO TABS: 20.0000 mg | ORAL_TABLET | Freq: Every day | ORAL | 3 refills | Status: AC

## 2023-01-12 ENCOUNTER — Encounter: Payer: Self-pay | Admitting: Nurse Practitioner

## 2023-01-21 ENCOUNTER — Other Ambulatory Visit: Payer: Self-pay | Admitting: Emergency Medicine

## 2023-01-21 DIAGNOSIS — I10 Essential (primary) hypertension: Secondary | ICD-10-CM

## 2023-05-25 ENCOUNTER — Ambulatory Visit: Payer: No Typology Code available for payment source | Admitting: Emergency Medicine

## 2023-07-04 ENCOUNTER — Emergency Department (HOSPITAL_BASED_OUTPATIENT_CLINIC_OR_DEPARTMENT_OTHER): Payer: Self-pay

## 2023-07-04 ENCOUNTER — Other Ambulatory Visit: Payer: Self-pay

## 2023-07-04 ENCOUNTER — Emergency Department (HOSPITAL_BASED_OUTPATIENT_CLINIC_OR_DEPARTMENT_OTHER)
Admission: EM | Admit: 2023-07-04 | Discharge: 2023-07-04 | Disposition: A | Discharge status: Home / Self Care | Payer: Self-pay | Attending: Emergency Medicine | Admitting: Emergency Medicine

## 2023-07-04 ENCOUNTER — Encounter (HOSPITAL_BASED_OUTPATIENT_CLINIC_OR_DEPARTMENT_OTHER): Payer: Self-pay | Admitting: Emergency Medicine

## 2023-07-04 DIAGNOSIS — I10 Essential (primary) hypertension: Secondary | ICD-10-CM | POA: Insufficient documentation

## 2023-07-04 DIAGNOSIS — Z7982 Long term (current) use of aspirin: Secondary | ICD-10-CM | POA: Insufficient documentation

## 2023-07-04 DIAGNOSIS — Z79899 Other long term (current) drug therapy: Secondary | ICD-10-CM | POA: Insufficient documentation

## 2023-07-04 DIAGNOSIS — M545 Low back pain, unspecified: Secondary | ICD-10-CM | POA: Insufficient documentation

## 2023-07-04 LAB — URINALYSIS, ROUTINE W REFLEX MICROSCOPIC
Bilirubin Urine: NEGATIVE
Glucose, UA: NEGATIVE mg/dL
Hgb urine dipstick: NEGATIVE
Ketones, ur: NEGATIVE mg/dL
Nitrite: NEGATIVE
Protein, ur: NEGATIVE mg/dL
Specific Gravity, Urine: 1.019 (ref 1.005–1.030)
pH: 6.5 (ref 5.0–8.0)

## 2023-07-04 LAB — CBC WITH DIFFERENTIAL/PLATELET
Abs Immature Granulocytes: 0.02 10*3/uL (ref 0.00–0.07)
Basophils Absolute: 0.1 10*3/uL (ref 0.0–0.1)
Basophils Relative: 1 %
Eosinophils Absolute: 0.1 10*3/uL (ref 0.0–0.5)
Eosinophils Relative: 2 %
HCT: 36.9 % (ref 36.0–46.0)
Hemoglobin: 12.4 g/dL (ref 12.0–15.0)
Immature Granulocytes: 0 %
Lymphocytes Relative: 26 %
Lymphs Abs: 1.8 10*3/uL (ref 0.7–4.0)
MCH: 31 pg (ref 26.0–34.0)
MCHC: 33.6 g/dL (ref 30.0–36.0)
MCV: 92.3 fL (ref 80.0–100.0)
Monocytes Absolute: 0.6 10*3/uL (ref 0.1–1.0)
Monocytes Relative: 9 %
Neutro Abs: 4.2 10*3/uL (ref 1.7–7.7)
Neutrophils Relative %: 62 %
Platelets: 180 10*3/uL (ref 150–400)
RBC: 4 MIL/uL (ref 3.87–5.11)
RDW: 12.7 % (ref 11.5–15.5)
WBC: 6.8 10*3/uL (ref 4.0–10.5)
nRBC: 0 % (ref 0.0–0.2)

## 2023-07-04 LAB — COMPREHENSIVE METABOLIC PANEL
ALT: 15 U/L (ref 0–44)
AST: 16 U/L (ref 15–41)
Albumin: 4.2 g/dL (ref 3.5–5.0)
Alkaline Phosphatase: 57 U/L (ref 38–126)
Anion gap: 8 (ref 5–15)
BUN: 16 mg/dL (ref 8–23)
CO2: 27 mmol/L (ref 22–32)
Calcium: 9.2 mg/dL (ref 8.9–10.3)
Chloride: 104 mmol/L (ref 98–111)
Creatinine, Ser: 0.71 mg/dL (ref 0.44–1.00)
GFR, Estimated: >60 mL/min (ref >60)
Glucose, Bld: 99 mg/dL (ref 70–99)
Potassium: 4 mmol/L (ref 3.5–5.1)
Sodium: 139 mmol/L (ref 135–145)
Total Bilirubin: 0.4 mg/dL (ref <1.2)
Total Protein: 7.1 g/dL (ref 6.5–8.1)

## 2023-07-04 LAB — LIPASE, BLOOD: Lipase: 24 U/L (ref 11–51)

## 2023-07-04 MED ORDER — NAPROXEN 500 MG PO TABS: 500.0000 mg | ORAL_TABLET | Freq: Two times a day (BID) | ORAL | 0 refills | Status: DC

## 2023-07-04 MED ORDER — TIZANIDINE HCL 4 MG PO TABS: 4.0000 mg | ORAL_TABLET | Freq: Four times a day (QID) | ORAL | 0 refills | Status: DC | PRN

## 2023-07-04 MED ORDER — NAPROXEN 250 MG PO TABS
500.0000 mg | ORAL_TABLET | Freq: Once | ORAL | Status: AC
  Administered 2023-07-04: 500 mg via ORAL
  Filled 2023-07-04: qty 2

## 2023-07-04 MED ORDER — MORPHINE SULFATE (PF) 4 MG/ML IV SOLN
4.0000 mg | Freq: Once | INTRAVENOUS | Status: AC
  Administered 2023-07-04: 4 mg via INTRAVENOUS
  Filled 2023-07-04: qty 1

## 2023-07-04 MED ORDER — ACETAMINOPHEN 325 MG PO TABS
650.0000 mg | ORAL_TABLET | Freq: Once | ORAL | Status: AC
  Administered 2023-07-04: 650 mg via ORAL
  Filled 2023-07-04: qty 2

## 2023-07-04 MED ORDER — TIZANIDINE HCL 2 MG PO TABS
4.0000 mg | ORAL_TABLET | Freq: Once | ORAL | Status: AC
  Administered 2023-07-04: 4 mg via ORAL
  Filled 2023-07-04: qty 2

## 2023-07-04 NOTE — Discharge Instructions (Addendum)
Apply ice for 30 minutes at a time, 4 times a day.  In addition to naproxen, you can take acetaminophen for additional pain relief.  Naproxen and acetaminophen work on pain in different ways, and the combination works better than taking either medication by itself.  Please discuss with your primary care provider whether physical therapy might be helpful for you.

## 2023-07-04 NOTE — ED Provider Notes (Signed)
[place] EMERGENCY DEPARTMENT AT [facility] Provider Note   CSN: [id number] Arrival date & time: 07/04/23  0434     History  Chief Complaint  Patient presents with   Back Pain    Priscilla Lane is a 62 y.o. female.  The history is provided by the patient.  Back Pain She has history of hypertension, hyperlipidemia and comes in with onset last night of right lumbar pain without radiation.  She denies nausea or vomiting and denies any urinary difficulty.  She denies weakness, numbness, tingling.  She denies any unusual activity.  She has not had back pain previously.   Home Medications Prior to Admission medications   Medication Sig Start Date End Date Taking? Authorizing Provider  amLODipine (NORVASC) 10 MG tablet Take 1 tablet (10 mg total) by mouth daily. 06/18/22   Georgina Quint, MD  aspirin 81 MG tablet Take 1 tablet (81 mg total) by mouth daily. 08/14/16   Doristine Bosworth, MD  losartan (COZAAR) 50 MG tablet TAKE 1 TABLET BY MOUTH DAILY 01/22/23   Georgina Quint, MD  rosuvastatin (CRESTOR) 20 MG tablet Take 1 tablet (20 mg total) by mouth daily. 11/20/22   Georgina Quint, MD      Allergies    Amlodipine, Lisinopril, and Cephalexin    Review of Systems   Review of Systems  Musculoskeletal:  Positive for back pain.  All other systems reviewed and are negative.   Physical Exam Updated Vital Signs BP (!) 155/92   Pulse 75   Temp 98.2 F (36.8 C)   Resp 18   Ht 5\' 3"  (1.6 m)   Wt 79 kg   SpO2 100%   BMI 30.85 kg/m  Physical Exam Vitals and nursing note reviewed.   62 year old female, resting comfortably and in no acute distress. Vital signs are significant for elevated blood pressure. Oxygen saturation is 100%, which is normal. Head is normocephalic and atraumatic. PERRLA, EOMI. Oropharynx is clear. Neck is nontender and supple without adenopathy or JVD. Back is nontender in the midline and there is no CVA tenderness.  Straight leg  raise is faintly positive on the right at 60 degrees, negative on the left. Lungs are clear without rales, wheezes, or rhonchi. Chest is nontender. Heart has regular rate and rhythm without murmur. Abdomen is soft, flat, nontender. Extremities have no cyanosis or edema, full range of motion is present. Skin is warm and dry without rash. Neurologic: Mental status is normal, strength is 5/5 in all 4 extremities, there are no sensory deficits.  ED Results / Procedures / Treatments   Labs (all labs ordered are listed, but only abnormal results are displayed) Labs Reviewed  URINALYSIS, ROUTINE W REFLEX MICROSCOPIC - Abnormal; Notable for the following components:      Result Value   Leukocytes,Ua SMALL (*)    Bacteria, UA RARE (*)    All other components within normal limits  COMPREHENSIVE METABOLIC PANEL  LIPASE, BLOOD  CBC WITH DIFFERENTIAL/PLATELET   Radiology CT Renal Stone Study Result Date: 07/04/2023 CLINICAL DATA:  Lower right-sided back pain since last night EXAM: CT ABDOMEN AND PELVIS WITHOUT CONTRAST TECHNIQUE: Multidetector CT imaging of the abdomen and pelvis was performed following the standard protocol without IV contrast. RADIATION DOSE REDUCTION: This exam was performed according to the departmental dose-optimization program which includes automated exposure control, adjustment of the mA and/or kV according to patient size and/or use of iterative reconstruction technique. COMPARISON:  None Available. FINDINGS: Lower  chest:  No contributory findings. Hepatobiliary: Hepatic steatosis with pericholecystic sparing. No focal lesion is seen.No evidence of biliary obstruction or stone. Pancreas: Unremarkable. Spleen: Unremarkable. Adrenals/Urinary Tract: Negative adrenals. No hydronephrosis or stone. Unremarkable bladder. Stomach/Bowel: No obstruction. No appendicitis. Colonic diverticulosis without diverticulitis, moderately extensive. Vascular/Lymphatic: No acute vascular abnormality.  No mass or adenopathy. Reproductive:No pathologic findings. Other: No ascites or pneumoperitoneum. Musculoskeletal: Lumbar spine degeneration, especially advanced at L4-5 facets where there is anterolisthesis and spinal canal/foraminal impingement IMPRESSION: 1. No acute finding. 2. Colonic diverticulosis and hepatic steatosis. 3. 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 Electronically Signed   By: Tiburcio Pea M.D.   On: 07/04/2023 06:31    Procedures Procedures    Medications Ordered in ED Medications  naproxen (NAPROSYN) tablet 500 mg (has no administration in time range)  tiZANidine (ZANAFLEX) tablet 4 mg (has no administration in time range)  acetaminophen (TYLENOL) tablet 650 mg (has no administration in time range)  morphine (PF) 4 MG/ML injection 4 mg (4 mg Intravenous Given 07/04/23 0534)    ED Course/ Medical Decision Making/ A&P                                 Medical Decision Making Amount and/or Complexity of Data Reviewed Labs: ordered. Radiology: ordered.  Risk OTC drugs. Prescription drug management.   Left lumbar pain.  Consider musculoskeletal pain, renal colic from ureterolithiasis, pyelonephritis, appendicitis, diverticulitis.  I reviewed her past records, and she has no prior imaging of abdomen or lumbar spine.  I have ordered morphine for pain and I have ordered workup of CBC, comprehensive metabolic panel, lipase, urinalysis, renal stone protocol CT scan.  I have reviewed her laboratory tests, and my interpretation is normal comprehensive metabolic panel, normal lipase, normal CBC, normal urinalysis.  CT scan shows no acute findings but does show significant degenerative changes in the lumbar spine with mild L4-5 anterolisthesis and spinal stenosis.  I have independently viewed the images, and agree with radiologist's interpretation.  She had good relief of pain with morphine.  I have ordered a dose of naproxen,  tizanidine, acetaminophen.  I am discharging her with prescriptions for tizanidine and naproxen and have advised her to use acetaminophen as needed for additional pain relief.  In addition, I have recommended that she apply ice, discussed with her primary care provider possible referral for physical therapy.  Final Clinical Impression(s) / ED Diagnoses Final diagnoses:  Acute right-sided low back pain without sciatica    Rx / DC Orders ED Discharge Orders          Ordered    naproxen (NAPROSYN) 500 MG tablet  2 times daily        07/04/23 0640    tiZANidine (ZANAFLEX) 4 MG tablet  Every 6 hours PRN        07/04/23 0640              Dione Booze, MD 07/04/23 [phone]

## 2023-07-04 NOTE — ED Triage Notes (Signed)
Pt c/o lower right sided back pain since about 2300 last night.

## 2023-07-28 ENCOUNTER — Other Ambulatory Visit: Payer: Self-pay | Admitting: Emergency Medicine

## 2023-07-28 DIAGNOSIS — I10 Essential (primary) hypertension: Secondary | ICD-10-CM

## 2023-08-26 ENCOUNTER — Other Ambulatory Visit: Payer: Self-pay | Admitting: Emergency Medicine

## 2023-08-26 DIAGNOSIS — I10 Essential (primary) hypertension: Secondary | ICD-10-CM

## 2023-09-07 ENCOUNTER — Encounter: Payer: Self-pay | Admitting: Emergency Medicine

## 2023-09-07 ENCOUNTER — Ambulatory Visit (INDEPENDENT_AMBULATORY_CARE_PROVIDER_SITE_OTHER): Payer: Self-pay | Admitting: Emergency Medicine

## 2023-09-07 VITALS — BP 152/90 | HR 84 | Temp 98.1°F | Ht 63.0 in | Wt 183.0 lb

## 2023-09-07 DIAGNOSIS — M48061 Spinal stenosis, lumbar region without neurogenic claudication: Secondary | ICD-10-CM

## 2023-09-07 DIAGNOSIS — M25561 Pain in right knee: Secondary | ICD-10-CM

## 2023-09-07 DIAGNOSIS — M4726 Other spondylosis with radiculopathy, lumbar region: Secondary | ICD-10-CM | POA: Insufficient documentation

## 2023-09-07 DIAGNOSIS — I10 Essential (primary) hypertension: Secondary | ICD-10-CM

## 2023-09-07 DIAGNOSIS — G8929 Other chronic pain: Secondary | ICD-10-CM

## 2023-09-07 DIAGNOSIS — E785 Hyperlipidemia, unspecified: Secondary | ICD-10-CM

## 2023-09-07 DIAGNOSIS — M1711 Unilateral primary osteoarthritis, right knee: Secondary | ICD-10-CM

## 2023-09-07 MED ORDER — MELOXICAM 15 MG PO TABS: 15.0000 mg | ORAL_TABLET | Freq: Every day | ORAL | 0 refills | Status: DC

## 2023-09-07 NOTE — Assessment & Plan Note (Signed)
 Chronic stable condition Continue rosuvastatin 10 mg daily. Diet and nutrition discussed Benefits of exercise discussed

## 2023-09-07 NOTE — Progress Notes (Signed)
 Priscilla Lane 63 y.o.   Chief Complaint  Patient presents with   leg ache    Leg aches has gotten worse this week, worse at night. Started at right knee then moves up to her hip. Feels like its bone on bone. Left knee pain that hurts at the front and back. No swelling     HISTORY OF PRESENT ILLNESS: This is a 63 y.o. female complaining of chronic pain to right knee radiating up into her right hip area Also complaining of occasional intermittent lumbar pain. Was seen by sports medicine in the past for right knee pain.  Had injection with moderate relief Has been taking over-the-counter Tylenol with some relief No other associated symptoms No other complaints or medical concerns today.  Musculoskeletal: Lumbar spine degeneration, especially advanced at L4-5 facets where there is anterolisthesis and spinal canal/foraminal impingement   IMPRESSION: 1. No acute finding. 2. Colonic diverticulosis and hepatic steatosis. 3. 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     Electronically Signed   By: Tiburcio Pea M.D.   On: 07/04/2023 06:31  HPI   Prior to Admission medications   Medication Sig Start Date End Date Taking? Authorizing Provider  amLODipine (NORVASC) 10 MG tablet TAKE 1 TABLET BY MOUTH DAILY 07/28/23  Yes Lin Hackmann, Eilleen Kempf, MD  aspirin 81 MG tablet Take 1 tablet (81 mg total) by mouth daily. 08/14/16  Yes Collie Siad A, MD  losartan (COZAAR) 50 MG tablet TAKE 1 TABLET BY MOUTH DAILY 08/26/23  Yes Geneen Dieter, Eilleen Kempf, MD  rosuvastatin (CRESTOR) 20 MG tablet Take 1 tablet (20 mg total) by mouth daily. 11/20/22  Yes Gay Moncivais, Eilleen Kempf, MD  naproxen (NAPROSYN) 500 MG tablet Take 1 tablet (500 mg total) by mouth 2 (two) times daily. Patient not taking: Reported on 09/07/2023 07/04/23   Dione Booze, MD  tiZANidine (ZANAFLEX) 4 MG tablet Take 1 tablet (4 mg total) by mouth every 6 (six) hours as needed for muscle  spasms. Patient not taking: Reported on 09/07/2023 07/04/23   Dione Booze, MD    Allergies  Allergen Reactions   Amlodipine     Lower extremity edema   Lisinopril     Cough, muscle cramps   Cephalexin Rash    Taking cephalexin for 2 days and is broken out in a facial rash with swelling of cheeks and eyelids. Incidentally, was also taking some ibuprofen at the same time but she has tolerated that well in the past and it probably is not the allergen.    Patient Active Problem List   Diagnosis Date Noted   Dyslipidemia 05/07/2021   Situational mixed anxiety and depressive disorder 01/31/2021   Hypertension, essential    Class 1 obesity due to excess calories with serious comorbidity and body mass index (BMI) of 33.0 to 33.9 in adult     Past Medical History:  Diagnosis Date   Allergic rhinitis    pollen   Hypertension, essential    Obesity     Past Surgical History:  Procedure Laterality Date   ABDOMINOPLASTY  2003   COLONOSCOPY  06/2014   polyp - colon tissue, mod diverticulosis, rpt 10 yrs [facility])   saline infusion [place]     for postmenopausal bleeding/fibroids/thickened endometrium ([place])    Social History   Socioeconomic History   Marital status: Married    Spouse name: Not on file   Number of children: 3   Years of education: Not on file   Highest education  level: High school graduate  Occupational History   Not on file  Tobacco Use   Smoking status: Never   Smokeless tobacco: Never  Vaping Use   Vaping status: Never Used  Substance and Sexual Activity   Alcohol use: No    Alcohol/week: 0.0 standard drinks of alcohol   Drug use: No   Sexual activity: Not Currently  Other Topics Concern   Not on file  Social History Narrative   Lives with husband, 1 dog   Grown children, 3 grandchildren   Occupation: homemaker, 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   Edu: HS   Activity: 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   Diet: good water, fruits/vegetables daily   Social Drivers of  Corporate investment banker Strain: Not on file  Food Insecurity: No Food Insecurity (02/14/2021)   Hunger Vital Sign    Worried About Running Out of Food in the Last Year: Never true    Ran Out of Food in the Last Year: Never true  Transportation Needs: No Transportation Needs (02/14/2021)   PRAPARE - Administrator, Civil Service (Medical): No    Lack of Transportation (Non-Medical): No  Physical Activity: Not on file  Stress: Not on file  Social Connections: Not on file  Intimate Partner Violence: Not on file    Family History  Problem Relation Age of Onset   Diabetes Mother    CAD Mother 6       CABG   Hypertension Mother    Kidney disease Mother        ESRD on HD   Colon polyps Father    Cancer Maternal Grandmother 56       GYN   Ovarian cancer Maternal Grandmother    Cancer Maternal Grandfather        HEENT   Colon cancer Neg Hx    Rectal cancer Neg Hx    Stomach cancer Neg Hx      Review of Systems  Constitutional: Negative.  Negative for chills and fever.  HENT: Negative.  Negative for congestion and sore throat.   Respiratory: Negative.  Negative for cough and shortness of breath.   Cardiovascular: Negative.  Negative for chest pain and palpitations.  Gastrointestinal:  Negative for abdominal pain, nausea and vomiting.  Genitourinary: Negative.  Negative for dysuria and hematuria.  Musculoskeletal:  Positive for back pain and joint pain.  Skin: Negative.  Negative for rash.  Neurological: Negative.  Negative for dizziness and headaches.  All other systems reviewed and are negative.   Vitals:   09/07/23 0813  BP: (!) 152/90  Pulse: 84  Temp: 98.1 F (36.7 C)  SpO2: 94%    Physical Exam Vitals reviewed.  Constitutional:      Appearance: Normal appearance.  HENT:     Head: Normocephalic.  Eyes:     Extraocular Movements: Extraocular movements intact.  Cardiovascular:     Rate and Rhythm: Normal rate.  Pulmonary:     Effort: Pulmonary  effort is normal.  Abdominal:     Palpations: Abdomen is soft.     Tenderness: There is no abdominal tenderness.  Skin:    General: Skin is warm and dry.     Capillary Refill: Capillary refill takes less than 2 seconds.  Neurological:     Mental Status: She is alert and oriented to person, place, and time.     Sensory: No sensory deficit.     Motor: No weakness.     Gait: Gait normal.  Psychiatric:  Mood and Affect: Mood normal.        Behavior: Behavior normal.      ASSESSMENT & PLAN: A total of 46 minutes was spent with the patient and counseling/coordination of care regarding preparing for this visit, review of most recent office visit notes, review of multiple chronic medical conditions and their management, review of all medications, review of most recent imaging reports, pain management, review of most recent bloodwork results, review of health maintenance items, education on nutrition, prognosis, documentation, and need for follow up.   Problem List Items Addressed This Visit       Cardiovascular and Mediastinum   Hypertension, essential   Elevated blood pressure reading in the office today but normal at home Continue amlodipine 10 mg and losartan 50 mg daily Cardiovascular risks associated with uncontrolled hypertension discussed Dietary approaches to stop hypertension discussed Blood work done today Follow-up in 6 months        Nervous and Auditory   Osteoarthritis of spine with radiculopathy, lumbar region - Primary   Active and affecting quality of life CT scan report done last December reviewed with patient Which shows advanced L4-L5 degenerative disease with spinal stenosis Pain management discussed Needs orthopedic evaluation Referral placed today      Relevant Medications   meloxicam (MOBIC) 15 MG tablet   Other Relevant Orders   Ambulatory referral to Orthopedic Surgery     Musculoskeletal and Integument   Arthritis of right knee   Chronic  right knee pain radiating to right hip Recent x-ray shows arthritic changes Needs orthopedic evaluation Recommend daily meloxicam 15 mg      Relevant Medications   meloxicam (MOBIC) 15 MG tablet   Other Relevant Orders   Ambulatory referral to Orthopedic Surgery     Other   Dyslipidemia   Chronic stable condition Continue rosuvastatin 10 mg daily Diet and nutrition discussed Benefits of exercise discussed      Spinal stenosis at L4-L5 level   As revealed on recent CT scan done December 2024 Needs orthopedic evaluation Referral placed today      Relevant Medications   meloxicam (MOBIC) 15 MG tablet   Other Relevant Orders   Ambulatory referral to Orthopedic Surgery   Chronic pain of right knee   Pain management discussed May continue over-the-counter Tylenol Recommend to start daily meloxicam 50 mg May benefit from physical therapy Orthopedic referral placed today      Relevant Medications   meloxicam (MOBIC) 15 MG tablet   Other Relevant Orders   Ambulatory referral to Orthopedic Surgery   Patient Instructions  Osteoarthritis  Osteoarthritis is a type of arthritis. It refers to joint pain or joint disease. Osteoarthritis affects tissue that covers the ends of bones in joints (cartilage). Cartilage acts as a cushion between the bones and helps them move smoothly. Osteoarthritis occurs when cartilage in the joints gets worn down. Osteoarthritis is sometimes called "wear and tear" arthritis. Osteoarthritis is the most common form of arthritis. It often occurs in older people. It is a condition that gets worse over time. The joints most often affected by this condition are in the fingers, toes, hips, knees, and spine, including the neck and lower back. What are the causes? This condition is caused by the wearing down of cartilage that covers the ends of bones. What increases the risk? The following factors may make you more likely to develop this condition: Being age 90  or older. Obesity. Overuse of joints. Past injury of a joint. Past  surgery on a joint. Family history of osteoarthritis. What are the signs or symptoms? The main symptoms of this condition are pain, swelling, and stiffness in the joint. Other symptoms may include: An enlarged joint. More pain and further damage caused by small pieces of bone or cartilage that break off and float inside of the joint. Small deposits of bone (osteophytes) that grow on the edges of the joint. A grating or scraping feeling inside the joint when you move it. Popping or creaking sounds when you move. Difficulty walking or exercising. An inability to grip items, twist your hand, or control the movements of your hands and fingers. How is this diagnosed? This condition may be diagnosed based on: Your medical history. A physical exam. Your symptoms. X-rays of the affected joints. Blood tests to rule out other types of arthritis. How is this treated? There is no cure for this condition, but treatment can help control pain and improve joint function. Treatment may include a combination of therapies, such as: Pain relief techniques, such as: Applying heat and cold to the joint. Massage. A form of talk therapy called cognitive behavioral therapy (CBT). This therapy helps you set goals and follow up on the changes that you make. Medicines for pain and inflammation. The medicines can be taken by mouth or applied to the skin. They include: NSAIDs, such as ibuprofen. Prescription medicines. Strong anti-inflammatory medicines (corticosteroids). Certain nutritional supplements. A prescribed exercise program. You may work with a physical therapist. Assistive devices, such as a brace, wrap, splint, specialized glove, or cane. A weight control plan. Surgery, such as: An osteotomy. This is done to reposition the bones and relieve pain or to remove loose pieces of bone and cartilage. Joint replacement surgery. You may  need this surgery if you have advanced osteoarthritis. Follow these instructions at home: Activity Rest your affected joints as told by your health care provider. Exercise as told by your provider. The provider may recommend specific types of exercise, such as: Strengthening exercises. These are done to strengthen the muscles that support joints affected by arthritis. Aerobic activities. These are exercises, such as brisk walking or water aerobics, that increase your heart rate. Range-of-motion activities. These help your joints move more easily. Balance and agility exercises. Managing pain, stiffness, and swelling     If told, apply heat to the affected area as often as told by your provider. Use the heat source that your provider recommends, such as a moist heat pack or a heating pad. If you have a removable assistive device, remove it as told by your provider. Place a towel between your skin and the heat source. If your provider tells you to keep the assistive device on while you apply heat, place a towel between the assistive device and the heat source. Leave the heat on for 20-30 minutes. If told, put ice on the affected area. If you have a removable assistive device, remove it as told by your provider. Put ice in a plastic bag. Place a towel between your skin and the bag. If your provider tells you to keep the assistive device on during icing, place a towel between the assistive device and the bag. Leave the ice on for 20 minutes, 2-3 times a day. If your skin turns bright red, remove the ice or heat right away to prevent skin damage. The risk of damage is higher if you cannot feel pain, heat, or cold. Move your fingers or toes often to reduce stiffness and swelling. Raise (elevate)  the affected area above the level of your heart while you are sitting or lying down. General instructions Take over-the-counter and prescription medicines only as told by your provider. Maintain a healthy  weight. Follow instructions from your provider for weight control. Do not use any products that contain nicotine or tobacco. These products include cigarettes, chewing tobacco, and vaping devices, such as e-cigarettes. If you need help quitting, ask your provider. Use assistive devices as told by your provider. Where to find more information [employer] of Arthritis and Musculoskeletal and Skin Diseases: niams.http://www.myers.net/ [employer] on Aging: BaseRingTones.pl American College of Rheumatology: rheumatology.org Contact a health care provider if: You have redness, swelling, or a feeling of warmth in a joint that gets worse. You have a fever along with joint or muscle aches. You develop a rash. You have trouble doing your normal activities. You have pain that gets worse and is not relieved by pain medicine. This information is not intended to replace advice given to you by your health care provider. Make sure you discuss any questions you have with your health care provider. Document Revised: 02/27/2022 Document Reviewed: 02/27/2022 Elsevier Patient Education  2024 Elsevier Inc.    Edwina Barth, MD [facility]

## 2023-09-07 NOTE — Assessment & Plan Note (Addendum)
 Pain management discussed May continue over-the-counter Tylenol Recommend to start daily meloxicam 50 mg May benefit from physical therapy Orthopedic referral placed today

## 2023-09-07 NOTE — Patient Instructions (Signed)

## 2023-09-07 NOTE — Assessment & Plan Note (Signed)
 As revealed on recent CT scan done December 2024 Needs orthopedic evaluation Referral placed today

## 2023-09-07 NOTE — Assessment & Plan Note (Signed)
 Chronic right knee pain radiating to right hip Recent x-ray shows arthritic changes Needs orthopedic evaluation Recommend daily meloxicam 15 mg

## 2023-09-07 NOTE — Assessment & Plan Note (Signed)
 Elevated blood pressure reading in the office today but normal at home Continue amlodipine 10 mg and losartan 50 mg daily Cardiovascular risks associated with uncontrolled hypertension discussed Dietary approaches to stop hypertension discussed Blood work done today Follow-up in 6 months

## 2023-09-07 NOTE — Assessment & Plan Note (Signed)
 Active and affecting quality of life CT scan report done last December reviewed with patient Which shows advanced L4-L5 degenerative disease with spinal stenosis Pain management discussed Needs orthopedic evaluation Referral placed today

## 2023-09-29 ENCOUNTER — Encounter: Payer: Self-pay | Admitting: Orthopaedic Surgery

## 2023-09-29 ENCOUNTER — Ambulatory Visit (INDEPENDENT_AMBULATORY_CARE_PROVIDER_SITE_OTHER): Payer: Self-pay | Admitting: Orthopaedic Surgery

## 2023-09-29 ENCOUNTER — Other Ambulatory Visit (INDEPENDENT_AMBULATORY_CARE_PROVIDER_SITE_OTHER): Payer: Self-pay

## 2023-09-29 VITALS — BP 154/83 | HR 87 | Ht 63.0 in | Wt 183.0 lb

## 2023-09-29 DIAGNOSIS — M545 Low back pain, unspecified: Secondary | ICD-10-CM

## 2023-09-29 DIAGNOSIS — M25562 Pain in left knee: Secondary | ICD-10-CM

## 2023-09-29 NOTE — Progress Notes (Signed)
 Office Visit Note   Patient: Priscilla Lane           Date of Birth: 07-24-1960           MRN: [id number] Visit Date: 09/29/2023              Requested by: Georgina Quint, MD [address] [place],  [place] [address] PCP: Georgina Quint, MD   Assessment & Plan: Visit Diagnoses:  1. Acute pain of left knee   2. Acute right-sided low back pain, unspecified whether sciatica present     Plan: Patient with degenerative anterolisthesis grade 2 at L4-5 but does not have claudication symptoms at this time.  Non-smoker.  We discussed symptoms of spinal stenosis she will return if she has development of those symptoms.  Follow-Up Instructions: No follow-ups on file.   Orders:  Orders Placed This Encounter  Procedures   XR KNEE 3 VIEW LEFT   XR Lumbar Spine Complete   No orders of the defined types were placed in this encounter.     Procedures: No procedures performed   Clinical Data: No additional findings.   Subjective: Chief Complaint  Patient presents with   Lower Back - Pain   Right Knee - Pain   Left Knee - Pain    HPI 63 year old females had problems with knee pain worse on the right knee laterally than left and also back pain.  She has 3 daughters and is taking care of 7 grandchildren.  [name] is just a few months old.  Oldest is 12.  She states she is placed on meloxicam and her pain is gone much better she has minimal symptoms.  She denies claudication symptoms.  Prior to the meloxicam she is having back pain buttocks pain.  Review of Systems all other systems noncontributory.   Objective: Vital Signs: BP (!) 154/83   Pulse 87   Ht 5\' 3"  (1.6 m)   Wt 183 lb (83 kg)   BMI 32.42 kg/m   Physical Exam Constitutional:      Appearance: She is well-developed.  HENT:     Head: Normocephalic.     Right Ear: External ear normal.     Left Ear: External ear normal. There is no impacted cerumen.  Eyes:     Pupils: Pupils are equal, round,  and reactive to light.  Neck:     Thyroid: No thyromegaly.     Trachea: No tracheal deviation.  Cardiovascular:     Rate and Rhythm: Normal rate.  Pulmonary:     Effort: Pulmonary effort is normal.  Abdominal:     Palpations: Abdomen is soft.  Musculoskeletal:     Cervical back: No rigidity.  Skin:    General: Skin is warm and dry.  Neurological:     Mental Status: She is alert and oriented to person, place, and time.  Psychiatric:        Behavior: Behavior normal.     Ortho Exam patient has normal gait able to heel and toe walk easily.  Excellent muscle strength development upper and lower extremities.  Normal sensation.  Specialty Comments:  No specialty comments available.  Imaging: No results found.   PMFS History: Patient Active Problem List   Diagnosis Date Noted   Spinal stenosis at L4-L5 level 09/07/2023   Osteoarthritis of spine with radiculopathy, lumbar region 09/07/2023   Arthritis of right knee 09/07/2023   Chronic pain of right knee 09/07/2023   Dyslipidemia 05/07/2021   Situational  mixed anxiety and depressive disorder 01/31/2021   Hypertension, essential    Class 1 obesity due to excess calories with serious comorbidity and body mass index (BMI) of 33.0 to 33.9 in adult    Past Medical History:  Diagnosis Date   Allergic rhinitis    pollen   Hypertension, essential    Obesity     Family History  Problem Relation Age of Onset   Diabetes Mother    CAD Mother 29       CABG   Hypertension Mother    Kidney disease Mother        ESRD on HD   Colon polyps Father    Cancer Maternal Grandmother 67       GYN   Ovarian cancer Maternal Grandmother    Cancer Maternal Grandfather        HEENT   Colon cancer Neg Hx    Rectal cancer Neg Hx    Stomach cancer Neg Hx     Past Surgical History:  Procedure Laterality Date   ABDOMINOPLASTY  2003   COLONOSCOPY  06/2014   polyp - colon tissue, mod diverticulosis, rpt 10 yrs ([place])   saline infusion [place]      for postmenopausal bleeding/fibroids/thickened endometrium ([place])   Social History   Occupational History   Not on file  Tobacco Use   Smoking status: Never   Smokeless tobacco: Never  Vaping Use   Vaping status: Never Used  Substance and Sexual Activity   Alcohol use: No    Alcohol/week: 0.0 standard drinks of alcohol   Drug use: No   Sexual activity: Not Currently

## 2023-11-10 ENCOUNTER — Telehealth: Payer: Self-pay | Admitting: Emergency Medicine

## 2023-11-10 NOTE — Telephone Encounter (Signed)
 Copied from CRM [id number]. Topic: General - Other >> Nov 10, 2023  9:22 AM Bambi Bonine D wrote: Reason for CRM: Patient is requesting to speak to Dr.Sagardia's nurse regarding a medication refill. Patient wants to know if she can have a medication refilled that she prescribed by the hospital. Patient stated that she is taking Naproxen  500 Mg and needs to have the medication refilled.

## 2023-11-11 ENCOUNTER — Other Ambulatory Visit: Payer: Self-pay | Admitting: Emergency Medicine

## 2023-11-11 MED ORDER — NAPROXEN 500 MG PO TABS: 500.0000 mg | ORAL_TABLET | Freq: Two times a day (BID) | ORAL | 1 refills | Status: AC

## 2023-11-11 NOTE — Telephone Encounter (Signed)
 New prescription for naproxen  sent to pharmacy of record today.  I gave her a prescription for meloxicam  last February.  If she prefers to take naproxen  she needs to stop meloxicam .  Make sure she is clear about this.  Thanks.

## 2023-11-12 NOTE — Telephone Encounter (Signed)
 Spoke with patient and made her aware to not take meloxicam  if she is taking Naproxen . She understood and stated she's only been taking her 2 bp medications anyway's. She states she really only uses the naproxen  when she is have leg pain.

## 2023-12-01 ENCOUNTER — Ambulatory Visit (INDEPENDENT_AMBULATORY_CARE_PROVIDER_SITE_OTHER): Payer: Self-pay | Admitting: Radiology

## 2023-12-01 ENCOUNTER — Encounter: Payer: Self-pay | Admitting: Emergency Medicine

## 2023-12-01 ENCOUNTER — Ambulatory Visit: Admission: EM | Admit: 2023-12-01 | Discharge: 2023-12-01 | Discharge status: Against Medical Advice | Payer: Self-pay

## 2023-12-01 ENCOUNTER — Ambulatory Visit
Admission: EM | Admit: 2023-12-01 | Discharge: 2023-12-01 | Disposition: A | Discharge status: Home / Self Care | Payer: Self-pay | Attending: Internal Medicine | Admitting: Internal Medicine

## 2023-12-01 DIAGNOSIS — M25562 Pain in left knee: Secondary | ICD-10-CM

## 2023-12-01 MED ORDER — PREDNISONE 20 MG PO TABS
40.0000 mg | ORAL_TABLET | Freq: Every day | ORAL | 0 refills | Status: AC
Start: 2023-12-01 — End: 2023-12-06

## 2023-12-01 NOTE — ED Provider Notes (Signed)
 [facility]    CSN: [id number] Arrival date & time: 12/01/23  1051      History   Chief Complaint Chief Complaint  Patient presents with   Leg Pain    HPI Priscilla Lane is a 63 y.o. female.   Priscilla Lane is a 63 y.o. female presenting for chief complaint of left knee pain. Left knee pain started yesterday as an achy spasm sensation to the posterior left knee. This morning, she went to turn the blinds at her house when she felt and heard her left knee "pop" then felt the knee give out from underneath her.  Generalized pain to the posterior left knee followed popping noise and sensation. Her daughter who is with her at the bedside at visit states patient has a 2 story home with approximately 30 steps in the home. Patient frequently goes up and down the steps multiple times per day doing laundry etc.  Patient states that going up and down the stairs aggravates her left knee pain.  Denies recent steroid use.  Denies radicular pain to the left calf and the left foot/left hip.  No redness or swelling of the left calf.History of right chronic knee pain.  She has been treating left knee pain with naproxen  and Tylenol  at home without relief.  She used to take Mobic  but states this was ineffective.  She has been evaluated by orthopedic provider in the past (prescribed Mobic ).     Leg Pain   Past Medical History:  Diagnosis Date   Allergic rhinitis    pollen   Hypertension, essential    Obesity     Patient Active Problem List   Diagnosis Date Noted   Spinal stenosis at L4-L5 level 09/07/2023   Osteoarthritis of spine with radiculopathy, lumbar region 09/07/2023   Arthritis of right knee 09/07/2023   Chronic pain of right knee 09/07/2023   Dyslipidemia 05/07/2021   Situational mixed anxiety and depressive disorder 01/31/2021   Hypertension, essential    Class 1 obesity due to excess calories with serious comorbidity and body mass index (BMI) of 33.0 to 33.9 in adult      Past Surgical History:  Procedure Laterality Date   ABDOMINOPLASTY  2003   COLONOSCOPY  06/2014   polyp - colon tissue, mod diverticulosis, rpt 10 yrs ([place])   saline infusion US      for postmenopausal bleeding/fibroids/thickened endometrium ([place])    OB History   No obstetric history on file.      Home Medications    Prior to Admission medications   Medication Sig Start Date End Date Taking? Authorizing Provider  predniSONE  (DELTASONE ) 20 MG tablet Take 2 tablets (40 mg total) by mouth daily with breakfast for 5 days. 12/01/23 12/06/23 Yes Starlene Eaton, FNP  amLODipine  (NORVASC ) 10 MG tablet TAKE 1 TABLET BY MOUTH DAILY 07/28/23   Sagardia, Miguel Jose, MD  aspirin  81 MG tablet Take 1 tablet (81 mg total) by mouth daily. 08/14/16   Stallings, Zoe A, MD  losartan  (COZAAR ) 50 MG tablet TAKE 1 TABLET BY MOUTH DAILY 08/26/23   Sagardia, Miguel Jose, MD  naproxen  (NAPROSYN ) 500 MG tablet Take 1 tablet (500 mg total) by mouth 2 (two) times daily with a meal. 11/11/23   Sagardia, Isidro Margo, MD  rosuvastatin  (CRESTOR ) 20 MG tablet Take 1 tablet (20 mg total) by mouth daily. 11/20/22   Elvira Hammersmith, MD  tiZANidine  (ZANAFLEX ) 4 MG tablet Take 1 tablet (4 mg total) by mouth every  6 (six) hours as needed for muscle spasms. Patient not taking: Reported on 09/07/2023 07/04/23   Alissa April, MD    Family History Family History  Problem Relation Age of Onset   Diabetes Mother    CAD Mother 10       CABG   Hypertension Mother    Kidney disease Mother        ESRD on HD   Colon polyps Father    Cancer Maternal Grandmother 74       GYN   Ovarian cancer Maternal Grandmother    Cancer Maternal Grandfather        HEENT   Colon cancer Neg Hx    Rectal cancer Neg Hx    Stomach cancer Neg Hx     Social History Social History   Tobacco Use   Smoking status: Never   Smokeless tobacco: Never  Vaping Use   Vaping status: Never Used  Substance Use Topics   Alcohol use:  No    Alcohol/week: 0.0 standard drinks of alcohol   Drug use: No     Allergies   Amlodipine , Lisinopril , and Cephalexin    Review of Systems Review of Systems Per HPI  Physical Exam Triage Vital Signs ED Triage Vitals  Encounter Vitals Group     BP 12/01/23 1105 (!) 149/79     Systolic BP Percentile --      Diastolic BP Percentile --      Pulse Rate 12/01/23 1105 77     Resp 12/01/23 1105 20     Temp 12/01/23 1105 98.1 F (36.7 C)     Temp Source 12/01/23 1105 Oral     SpO2 12/01/23 1105 94 %     Weight --      Height --      Head Circumference --      Peak Flow --      Pain Score 12/01/23 1106 8     Pain Loc --      Pain Education --      Exclude from Growth Chart --    No data found.  Updated Vital Signs BP (!) 149/79 (BP Location: Right Arm)   Pulse 77   Temp 98.1 F (36.7 C) (Oral)   Resp 20   SpO2 94%   Visual Acuity Right Eye Distance:   Left Eye Distance:   Bilateral Distance:    Right Eye Near:   Left Eye Near:    Bilateral Near:     Physical Exam Vitals and nursing note reviewed.  Constitutional:      Appearance: She is not ill-appearing or toxic-appearing.  HENT:     Head: Normocephalic and atraumatic.     Right Ear: Hearing and external ear normal.     Left Ear: Hearing and external ear normal.     Nose: Nose normal.     Mouth/Throat:     Lips: Pink.  Eyes:     General: Lids are normal. Vision grossly intact. Gaze aligned appropriately.     Extraocular Movements: Extraocular movements intact.     Conjunctiva/sclera: Conjunctivae normal.  Pulmonary:     Effort: Pulmonary effort is normal.  Musculoskeletal:     Cervical back: Neck supple.     Right knee: Normal.     Left knee: Crepitus present. No swelling, deformity, effusion, erythema, ecchymosis, lacerations or bony tenderness. Normal range of motion. Tenderness (Diffuse tenderness to the bilateral joint lines and posterior left knee) present over the medial joint line, lateral  joint line and patellar tendon. Normal alignment, normal meniscus and normal patellar mobility. Normal pulse.     Comments: Ambulatory with limp favoring the left lower extremity secondary to left knee pain.  5/5 strength against resistance with flexion and extension at the left knee joint.  +2 bilateral popliteal pulses.  Skin:    General: Skin is warm and dry.     Capillary Refill: Capillary refill takes less than 2 seconds.     Findings: No rash.  Neurological:     General: No focal deficit present.     Mental Status: She is alert and oriented to person, place, and time. Mental status is at baseline.     Cranial Nerves: No dysarthria or facial asymmetry.  Psychiatric:        Mood and Affect: Mood normal.        Speech: Speech normal.        Behavior: Behavior normal.        Thought Content: Thought content normal.        Judgment: Judgment normal.      UC Treatments / Results  Labs (all labs ordered are listed, but only abnormal results are displayed) Labs Reviewed - No data to display  EKG   Radiology No results found.  Procedures Procedures (including critical care time)  Medications Ordered in UC Medications - No data to display  Initial Impression / Assessment and Plan / UC Course  I have reviewed the triage vital signs and the nursing notes.  Pertinent labs & imaging results that were available during my care of the patient were reviewed by me and considered in my medical decision making (see chart for details).   1.  Acute pain of left knee Left knee x-rays show significant degenerative changes and arthritis of the left knee on wet read, radiology reread is pending, staff will call if radiology reread shows new finding requiring change in treatment plan.  Will treat for acute arthritis flareup with prednisone  burst once daily for 5 days as pain and symptoms have not responded well to supportive care and use of naproxen /Tylenol  at home.  Recommend rest,  elevation, ice, and compression with knee sleeve.  Recommend follow-up with orthopedic provider for ongoing evaluation and management of acute flare of chronic left knee pain.  Counseled patient on potential for adverse effects with medications prescribed/recommended today, strict ER and return-to-clinic precautions discussed, patient verbalized understanding.    Final Clinical Impressions(s) / UC Diagnoses   Final diagnoses:  Acute pain of left knee     Discharge Instructions      You have arthritis of the left knee, this is likely what is causing your left knee pain. We will treat this with a short course of steroids. Take prednisone  once a day for the next 5 days. Take this medicine in the mornings with breakfast.  Please schedule a follow-up appointment with your orthopedic specialist if your pain does not improve.  Rest, ice, elevate.     ED Prescriptions     Medication Sig Dispense Auth. Provider   predniSONE  (DELTASONE ) 20 MG tablet Take 2 tablets (40 mg total) by mouth daily with breakfast for 5 days. 10 tablet Starlene Eaton, FNP      PDMP not reviewed this encounter.   Starlene Eaton, [place] 12/01/23 1239

## 2023-12-01 NOTE — Discharge Instructions (Addendum)
 You have arthritis of the left knee, this is likely what is causing your left knee pain. We will treat this with a short course of steroids. Take prednisone  once a day for the next 5 days. Take this medicine in the mornings with breakfast.  Please schedule a follow-up appointment with your orthopedic specialist if your pain does not improve.  Rest, ice, elevate.

## 2023-12-01 NOTE — ED Triage Notes (Signed)
 Pt states she her left leg was aching yesterday but today while reaching for blind her knee popped and she is now having pain in knee.

## 2023-12-02 ENCOUNTER — Ambulatory Visit: Payer: Self-pay

## 2023-12-16 ENCOUNTER — Ambulatory Visit: Payer: Self-pay | Admitting: Podiatry

## 2024-02-04 ENCOUNTER — Ambulatory Visit: Payer: Self-pay

## 2024-02-04 ENCOUNTER — Encounter: Payer: Self-pay | Admitting: Internal Medicine

## 2024-02-04 ENCOUNTER — Ambulatory Visit: Payer: Self-pay | Admitting: Internal Medicine

## 2024-02-04 VITALS — BP 186/82 | HR 88 | Temp 98.3°F | Ht 63.0 in | Wt 184.8 lb

## 2024-02-04 DIAGNOSIS — I10 Essential (primary) hypertension: Secondary | ICD-10-CM

## 2024-02-04 DIAGNOSIS — R051 Acute cough: Secondary | ICD-10-CM

## 2024-02-04 DIAGNOSIS — R059 Cough, unspecified: Secondary | ICD-10-CM | POA: Insufficient documentation

## 2024-02-04 DIAGNOSIS — R062 Wheezing: Secondary | ICD-10-CM | POA: Insufficient documentation

## 2024-02-04 MED ORDER — AZITHROMYCIN 250 MG PO TABS
ORAL_TABLET | ORAL | 1 refills | Status: AC
Start: 2024-02-04 — End: 2024-02-09

## 2024-02-04 MED ORDER — PREDNISONE 10 MG PO TABS
ORAL_TABLET | ORAL | 0 refills | Status: DC
Start: 2024-02-04 — End: 2024-02-23

## 2024-02-04 MED ORDER — HYDROCODONE BIT-HOMATROP MBR 5-1.5 MG/5ML PO SOLN
5.0000 mL | Freq: Four times a day (QID) | ORAL | 0 refills | Status: AC | PRN
Start: 2024-02-04 — End: 2024-02-14

## 2024-02-04 MED ORDER — ALBUTEROL SULFATE HFA 108 (90 BASE) MCG/ACT IN AERS: 2.0000 | INHALATION_SPRAY | Freq: Four times a day (QID) | RESPIRATORY_TRACT | 2 refills | Status: AC | PRN

## 2024-02-04 NOTE — Progress Notes (Signed)
 Patient ID: Priscilla Lane, female   DOB: 1961/02/19, 63 y.o.   MRN: [id number]        Chief Complaint: follow up htn, cough, wheezing        HPI:  Priscilla Lane is a 63 y.o. female Here with acute onset mild to mod 2-3 days ST, HA, general weakness and malaise, with prod cough greenish sputum with wheezing sob doe, but Pt denies chest pain, orthopnea, PND, increased LE swelling, palpitations, dizziness or syncope.   Pt denies polydipsia, polyuria, or new focal neuro s/s.    Pt denies recent night sweats, loss of appetite, or other constitutional symptoms         Wt Readings from Last 3 Encounters:  02/04/24 184 lb 12.8 oz (83.8 kg)  09/29/23 183 lb (83 kg)  09/07/23 183 lb (83 kg)   BP Readings from Last 3 Encounters:  02/04/24 (!) 186/82  12/01/23 (!) 149/79  09/29/23 (!) 154/83         Past Medical History:  Diagnosis Date   Allergic rhinitis    pollen   Hypertension, essential    Obesity    Past Surgical History:  Procedure Laterality Date   ABDOMINOPLASTY  2003   COLONOSCOPY  06/2014   polyp - colon tissue, mod diverticulosis, rpt 10 yrs ([place])   saline infusion US      for postmenopausal bleeding/fibroids/thickened endometrium ([place])    reports that she has never smoked. She has never used smokeless tobacco. She reports that she does not drink alcohol and does not use drugs. family history includes CAD (age of onset: 52) in her mother; Cancer in her maternal grandfather; Cancer (age of onset: 20) in her maternal grandmother; Colon polyps in her father; Diabetes in her mother; Hypertension in her mother; Kidney disease in her mother; Ovarian cancer in her maternal grandmother. Allergies  Allergen Reactions   Amlodipine      Lower extremity edema   Lisinopril      Cough, muscle cramps   Cephalexin  Rash    Taking cephalexin  for 2 days and is broken out in a facial rash with swelling of cheeks and eyelids. Incidentally, was also taking some ibuprofen  at the same time but  she has tolerated that well in the past and it probably is not the allergen.   Current Outpatient Medications on File Prior to Visit  Medication Sig Dispense Refill   amLODipine  (NORVASC ) 10 MG tablet TAKE 1 TABLET BY MOUTH DAILY 90 tablet 3   aspirin  81 MG tablet Take 1 tablet (81 mg total) by mouth daily. 90 tablet 3   losartan  (COZAAR ) 50 MG tablet TAKE 1 TABLET BY MOUTH DAILY 90 tablet 1   naproxen  (NAPROSYN ) 500 MG tablet Take 1 tablet (500 mg total) by mouth 2 (two) times daily with a meal. 30 tablet 1   rosuvastatin  (CRESTOR ) 20 MG tablet Take 1 tablet (20 mg total) by mouth daily. 90 tablet 3   tiZANidine  (ZANAFLEX ) 4 MG tablet Take 1 tablet (4 mg total) by mouth every 6 (six) hours as needed for muscle spasms. (Patient not taking: Reported on 02/04/2024) 40 tablet 0   No current facility-administered medications on file prior to visit.        ROS:  All others reviewed and negative.  Objective        PE:  BP (!) 186/82   Pulse 88   Temp 98.3 F (36.8 C)   Ht 5' 3 (1.6 m)   Wt 184 lb 12.8 oz (83.8  kg)   SpO2 97%   BMI 32.74 kg/m                 Constitutional: Pt appears mild ill               HENT: Head: NCAT.                Right Ear: External ear normal.                 Left Ear: External ear normal. Bilat tm's with mild erythema.  Max sinus areas non tender.  Pharynx with mild erythema, no exudate               Eyes: . Pupils are equal, round, and reactive to light. Conjunctivae and EOM are normal               Nose: without d/c or deformity               Neck: Neck supple. Gross normal ROM               Cardiovascular: Normal rate and regular rhythm.                 Pulmonary/Chest: Effort normal and breath sounds without rales but with mild bilat wheezing.                              Neurological: Pt is alert. At baseline orientation, motor grossly intact               Skin: Skin is warm. No rashes, no other new lesions, LE edema -  one               Psychiatric:  Pt behavior is normal without agitation   Micro: none  Cardiac tracings I have personally interpreted today:  none  Pertinent Radiological findings (summarize): none   Lab Results  Component Value Date   WBC 6.8 07/04/2023   HGB 12.4 07/04/2023   HCT 36.9 07/04/2023   PLT 180 07/04/2023   GLUCOSE 99 07/04/2023   CHOL 217 (H) 11/19/2022   TRIG 92.0 11/19/2022   HDL 59.20 11/19/2022   LDLCALC 139 (H) 11/19/2022   ALT 15 07/04/2023   AST 16 07/04/2023   NA 139 07/04/2023   K 4.0 07/04/2023   CL 104 07/04/2023   CREATININE 0.71 07/04/2023   BUN 16 07/04/2023   CO2 27 07/04/2023   TSH 3.50 04/10/2014   HGBA1C 5.7 11/19/2022   Assessment/Plan:  Priscilla Lane is a 63 y.o. Black or African American [2] female with  has a past medical history of Allergic rhinitis, Hypertension, essential, and Obesity.  Cough Mild to mod, c/w bronchitis vs pna, fo cxr , also for antibx course zpack, cough med prn,  to f/u any worsening symptoms or concerns  Hypertension, essential BP Readings from Last 3 Encounters:  02/04/24 (!) 186/82  12/01/23 (!) 149/79  09/29/23 (!) 154/83   uncontrolled, likely reactive, pt to continue medical treatment norvasc  10 every day, losartan  50 qd   Wheezing Mild to mod, for prednisone  taper, inhaler prn,  to f/u any worsening symptoms or concerns  Followup: Return if symptoms worsen or fail to improve.  Lynwood Rush, MD 02/04/2024 6:57 PM [facility]

## 2024-02-04 NOTE — Assessment & Plan Note (Signed)
 Mild to mod, for prednisone taper, inhaler prn,  to f/u any worsening symptoms or concerns

## 2024-02-04 NOTE — Assessment & Plan Note (Signed)
 BP Readings from Last 3 Encounters:  02/04/24 (!) 186/82  12/01/23 (!) 149/79  09/29/23 (!) 154/83   uncontrolled, likely reactive, pt to continue medical treatment norvasc  10 every day, losartan  50 qd

## 2024-02-04 NOTE — Telephone Encounter (Signed)
 FYI Only or Action Required?: Action required by provider: request for appointment.  Patient was last seen in primary care on 09/07/2023 by Purcell Emil Schanz, MD.  Called Nurse Triage reporting Cough.  Symptoms began several days ago.  Interventions attempted: Nothing.  Symptoms are: gradually worsening. Productive cough with green mucus, SOB, chest tightness. Fatigue, sore throat.  Triage Disposition: See HCP Within 4 Hours (Or PCP Triage)  Patient/caregiver understands and will follow disposition?: Yes   Copied from CRM #[id number]. Topic: Clinical - Red Word Triage >> Feb 04, 2024  8:47 AM Burnard DEL wrote: Red Word that prompted transfer to Nurse Triage: chest discomfort,discolored mucous,shortness of breathe Reason for Disposition  [1] MILD difficulty breathing (e.g., minimal/no SOB at rest, SOB with walking, pulse < 100) AND [2] still present when not coughing  Answer Assessment - Initial Assessment Questions 1. ONSET: When did the cough begin?      Sunday 2. SEVERITY: How bad is the cough today?      severe 3. SPUTUM: Describe the color of your sputum (e.g., none, dry cough; clear, white, yellow, green)     green 4. HEMOPTYSIS: Are you coughing up any blood? If Yes, ask: How much? (e.g., flecks, streaks, tablespoons, etc.)     no 5. DIFFICULTY BREATHING: Are you having difficulty breathing? If Yes, ask: How bad is it? (e.g., mild, moderate, severe)      mild 6. FEVER: Do you have a fever? If Yes, ask: What is your temperature, how was it measured, and when did it start?     no 7. CARDIAC HISTORY: Do you have any history of heart disease? (e.g., heart attack, congestive heart failure)      no 8. LUNG HISTORY: Do you have any history of lung disease?  (e.g., pulmonary embolus, asthma, emphysema)     no 9. PE RISK FACTORS: Do you have a history of blood clots? (or: recent major surgery, recent prolonged travel, bedridden)     no 10. OTHER SYMPTOMS:  Do you have any other symptoms? (e.g., runny nose, wheezing, chest pain)       Sore throat, fatigue 11. PREGNANCY: Is there any chance you are pregnant? When was your last menstrual period?       no 12. TRAVEL: Have you traveled out of the country in the last month? (e.g., travel history, exposures)       no  Protocols used: Cough - Acute Productive-A-AH

## 2024-02-04 NOTE — Assessment & Plan Note (Signed)
 Mild to mod, c/w bronchitis vs pna, fo cxr , also for antibx course zpack, cough med prn,  to f/u any worsening symptoms or concerns

## 2024-02-04 NOTE — Patient Instructions (Signed)
 Please take all new medication as prescribed - the antibiotic, cough medicine, prednisone  and inhaler  Please continue all other medications as before, and refills have been done if requested.  Please have the pharmacy call with any other refills you may need..  Please keep your appointments with your specialists as you may have planned

## 2024-02-23 ENCOUNTER — Encounter: Payer: Self-pay | Admitting: Emergency Medicine

## 2024-02-23 ENCOUNTER — Ambulatory Visit (INDEPENDENT_AMBULATORY_CARE_PROVIDER_SITE_OTHER): Payer: Self-pay | Admitting: Emergency Medicine

## 2024-02-23 VITALS — BP 170/86 | HR 88 | Temp 98.8°F | Ht 63.0 in | Wt 185.0 lb

## 2024-02-23 DIAGNOSIS — I10 Essential (primary) hypertension: Secondary | ICD-10-CM

## 2024-02-23 MED ORDER — VALSARTAN-HYDROCHLOROTHIAZIDE 320-12.5 MG PO TABS: 1.0000 | ORAL_TABLET | Freq: Every day | ORAL | 3 refills | Status: AC

## 2024-02-23 NOTE — Assessment & Plan Note (Signed)
 Uncontrolled hypertension Recently stopped both amlodipine  and losartan  due to side effects namely leg cramping Side effects got better when she stopped the medication but blood pressure readings went up. Recommend to start valsartan  HCT 320-12.5 mg daily EKG done today Advised to monitor blood pressure readings at home several times a day for the next couple weeks and contact the office if numbers persistently abnormal.

## 2024-02-23 NOTE — Progress Notes (Signed)
 Priscilla Lane 63 y.o.   Chief Complaint  Patient presents with   Cough    Patient here to discuss blood pressure medication. The one she was on was making her legs hurt. She went off medication and legs are not hurting. Also, having cough, sore throat on and off for 2 weeks. She saw Dr. Norleen 02/04/24 she had swollen lymph nodes. She was given prednisone  and albuterol      HISTORY OF PRESENT ILLNESS: This is a 63 y.o. female here for follow-up of hypertension Was taken losartan  and amlodipine .  Developed leg cramps.  Stopped medications and symptoms went away. However blood pressure readings became high.  Restarted medications and symptoms came back. Has also been on rosuvastatin  for many years with no problems. Recent URI.  Feeling better today.  No concerns. BP Readings from Last 3 Encounters:  02/23/24 (!) 170/86  02/04/24 (!) 186/82  12/01/23 (!) 149/79     Cough Pertinent negatives include no chest pain, chills, fever, headaches, rash or sore throat.     Prior to Admission medications   Medication Sig Start Date End Date Taking? Authorizing Provider  albuterol  (VENTOLIN  HFA) 108 (90 Base) MCG/ACT inhaler Inhale 2 puffs into the lungs every 6 (six) hours as needed for wheezing or shortness of breath. 02/04/24  Yes Norleen Lynwood ORN, MD  amLODipine  (NORVASC ) 10 MG tablet TAKE 1 TABLET BY MOUTH DAILY 07/28/23  Yes Laysha Childers, Emil Schanz, MD  aspirin  81 MG tablet Take 1 tablet (81 mg total) by mouth daily. 08/14/16  Yes Stallings, Zoe A, MD  losartan  (COZAAR ) 50 MG tablet TAKE 1 TABLET BY MOUTH DAILY 08/26/23  Yes Deunte Bledsoe, Emil Schanz, MD  naproxen  (NAPROSYN ) 500 MG tablet Take 1 tablet (500 mg total) by mouth 2 (two) times daily with a meal. 11/11/23  Yes Earnie Bechard, Emil Schanz, MD  rosuvastatin  (CRESTOR ) 20 MG tablet Take 1 tablet (20 mg total) by mouth daily. 11/20/22  Yes SagardiaEmil Schanz, MD    Allergies  Allergen Reactions   Amlodipine      Lower extremity edema   Lisinopril       Cough, muscle cramps   Cephalexin  Rash    Taking cephalexin  for 2 days and is broken out in a facial rash with swelling of cheeks and eyelids. Incidentally, was also taking some ibuprofen  at the same time but she has tolerated that well in the past and it probably is not the allergen.    Patient Active Problem List   Diagnosis Date Noted   Cough 02/04/2024   Wheezing 02/04/2024   Spinal stenosis at L4-L5 level 09/07/2023   Osteoarthritis of spine with radiculopathy, lumbar region 09/07/2023   Arthritis of right knee 09/07/2023   Chronic pain of right knee 09/07/2023   Dyslipidemia 05/07/2021   Situational mixed anxiety and depressive disorder 01/31/2021   Hypertension, essential    Class 1 obesity due to excess calories with serious comorbidity and body mass index (BMI) of 33.0 to 33.9 in adult     Past Medical History:  Diagnosis Date   Allergic rhinitis    pollen   Hypertension, essential    Obesity     Past Surgical History:  Procedure Laterality Date   ABDOMINOPLASTY  2003   COLONOSCOPY  06/2014   polyp - colon tissue, mod diverticulosis, rpt 10 yrs [facility])   saline infusion US      for postmenopausal bleeding/fibroids/thickened endometrium ([place])    Social History   Socioeconomic History   Marital status: Married  Spouse name: Not on file   Number of children: 3   Years of education: Not on file   Highest education level: High school graduate  Occupational History   Not on file  Tobacco Use   Smoking status: Never   Smokeless tobacco: Never  Vaping Use   Vaping status: Never Used  Substance and Sexual Activity   Alcohol use: No    Alcohol/week: 0.0 standard drinks of alcohol   Drug use: No   Sexual activity: Not Currently  Other Topics Concern   Not on file  Social History Narrative   Lives with husband, 1 dog   Grown children, 3 grandchildren   Occupation: homemaker, 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   Edu: HS   Activity: 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    Diet: good water, fruits/vegetables daily   Social Drivers of Corporate investment banker Strain: Not on file  Food Insecurity: No Food Insecurity (02/14/2021)   Hunger Vital Sign    Worried About Running Out of Food in the Last Year: Never true    Ran Out of Food in the Last Year: Never true  Transportation Needs: No Transportation Needs (02/14/2021)   PRAPARE - Administrator, Civil Service (Medical): No    Lack of Transportation (Non-Medical): No  Physical Activity: Not on file  Stress: Not on file  Social Connections: Not on file  Intimate Partner Violence: Not on file    Family History  Problem Relation Age of Onset   Diabetes Mother    CAD Mother 53       CABG   Hypertension Mother    Kidney disease Mother        ESRD on HD   Colon polyps Father    Cancer Maternal Grandmother 60       GYN   Ovarian cancer Maternal Grandmother    Cancer Maternal Grandfather        HEENT   Colon cancer Neg Hx    Rectal cancer Neg Hx    Stomach cancer Neg Hx      Review of Systems  Constitutional: Negative.  Negative for chills and fever.  HENT: Negative.  Negative for congestion and sore throat.   Respiratory:  Positive for cough.   Cardiovascular: Negative.  Negative for chest pain and palpitations.  Gastrointestinal:  Negative for nausea and vomiting.  Genitourinary: Negative.  Negative for dysuria and hematuria.  Skin: Negative.  Negative for rash.  Neurological: Negative.  Negative for dizziness and headaches.  All other systems reviewed and are negative.   Vitals:   02/23/24 1401  BP: (!) 170/86  Pulse: 88  Temp: 98.8 F (37.1 C)  SpO2: 95%    Physical Exam Vitals reviewed.  Constitutional:      Appearance: Normal appearance.  HENT:     Head: Normocephalic.     Mouth/Throat:     Mouth: Mucous membranes are moist.     Pharynx: Oropharynx is clear.  Eyes:     Extraocular Movements: Extraocular movements intact.     Pupils: Pupils are equal, round,  and reactive to light.  Cardiovascular:     Rate and Rhythm: Normal rate and regular rhythm.     Pulses: Normal pulses.     Heart sounds: Normal heart sounds.  Pulmonary:     Effort: Pulmonary effort is normal.     Breath sounds: Normal breath sounds.  Musculoskeletal:     Cervical back: No tenderness.     Right lower leg: No  edema.     Left lower leg: No edema.  Lymphadenopathy:     Cervical: No cervical adenopathy.  Skin:    General: Skin is warm and dry.     Capillary Refill: Capillary refill takes less than 2 seconds.  Neurological:     General: No focal deficit present.     Mental Status: She is alert and oriented to person, place, and time.  Psychiatric:        Mood and Affect: Mood normal.        Behavior: Behavior normal.     EKG: Normal sinus rhythm with ventricular rate of 79/min.  No acute ischemic changes.  Possible left atrial enlargement. ASSESSMENT & PLAN: A total of 43 minutes was spent with the patient and counseling/coordination of care regarding preparing for this visit, review of most recent office visit notes, review of multiple chronic medical conditions and their management, cardiovascular risks associated with uncontrolled hypertension, review of all medications and changes made, review of most recent bloodwork results, review of health maintenance items, education on nutrition, prognosis, documentation, and need for follow up.   Problem List Items Addressed This Visit       Cardiovascular and Mediastinum   Hypertension, essential - Primary   Uncontrolled hypertension Recently stopped both amlodipine  and losartan  due to side effects namely leg cramping Side effects got better when she stopped the medication but blood pressure readings went up. Recommend to start valsartan  HCT 320-12.5 mg daily EKG done today Advised to monitor blood pressure readings at home several times a day for the next couple weeks and contact the office if numbers persistently  abnormal.      Relevant Medications   valsartan -hydrochlorothiazide  (DIOVAN -HCT) 320-12.5 MG tablet   Patient Instructions  Stop amlodipine  and losartan  Start valsartan  HCT 320-12.5 mg daily Continue monitoring blood pressure at home several times a day for the next couple weeks and contact the office if numbers persistently abnormal.  Hypertension, Adult High blood pressure (hypertension) is when the force of blood pumping through the arteries is too strong. The arteries are the blood vessels that carry blood from the heart throughout the body. Hypertension forces the heart to work harder to pump blood and may cause arteries to become narrow or stiff. Untreated or uncontrolled hypertension can lead to a heart attack, heart failure, a stroke, kidney disease, and other problems. A blood pressure reading consists of a higher number over a lower number. Ideally, your blood pressure should be below 120/80. The first (top) number is called the systolic pressure. It is a measure of the pressure in your arteries as your heart beats. The second (bottom) number is called the diastolic pressure. It is a measure of the pressure in your arteries as the heart relaxes. What are the causes? The exact cause of this condition is not known. There are some conditions that result in high blood pressure. What increases the risk? Certain factors may make you more likely to develop high blood pressure. Some of these risk factors are under your control, including: Smoking. Not getting enough exercise or physical activity. Being overweight. Having too much fat, sugar, calories, or salt (sodium) in your diet. Drinking too much alcohol. Other risk factors include: Having a personal history of heart disease, diabetes, high cholesterol, or kidney disease. Stress. Having a family history of high blood pressure and high cholesterol. Having obstructive sleep apnea. Age. The risk increases with age. What are the  signs or symptoms? High blood pressure may not  cause symptoms. Very high blood pressure (hypertensive crisis) may cause: Headache. Fast or irregular heartbeats (palpitations). Shortness of breath. Nosebleed. Nausea and vomiting. Vision changes. Severe chest pain, dizziness, and seizures. How is this diagnosed? This condition is diagnosed by measuring your blood pressure while you are seated, with your arm resting on a flat surface, your legs uncrossed, and your feet flat on the floor. The cuff of the blood pressure monitor will be placed directly against the skin of your upper arm at the level of your heart. Blood pressure should be measured at least twice using the same arm. Certain conditions can cause a difference in blood pressure between your right and left arms. If you have a high blood pressure reading during one visit or you have normal blood pressure with other risk factors, you may be asked to: Return on a different day to have your blood pressure checked again. Monitor your blood pressure at home for 1 week or longer. If you are diagnosed with hypertension, you may have other blood or imaging tests to help your health care provider understand your overall risk for other conditions. How is this treated? This condition is treated by making healthy lifestyle changes, such as eating healthy foods, exercising more, and reducing your alcohol intake. You may be referred for counseling on a healthy diet and physical activity. Your health care provider may prescribe medicine if lifestyle changes are not enough to get your blood pressure under control and if: Your systolic blood pressure is above 130. Your diastolic blood pressure is above 80. Your personal target blood pressure may vary depending on your medical conditions, your age, and other factors. Follow these instructions at home: Eating and drinking  Eat a diet that is high in fiber and potassium, and low in sodium, added sugar, and  fat. An example of this eating plan is called the DASH diet. DASH stands for Dietary Approaches to Stop Hypertension. To eat this way: Eat plenty of fresh fruits and vegetables. Try to fill one half of your plate at each meal with fruits and vegetables. Eat whole grains, such as whole-wheat pasta, brown rice, or whole-grain bread. Fill about one fourth of your plate with whole grains. Eat or drink low-fat dairy products, such as skim milk or low-fat yogurt. Avoid fatty cuts of meat, processed or cured meats, and poultry with skin. Fill about one fourth of your plate with lean proteins, such as fish, chicken without skin, beans, eggs, or tofu. Avoid pre-made and processed foods. These tend to be higher in sodium, added sugar, and fat. Reduce your daily sodium intake. Many people with hypertension should eat less than 1,500 mg of sodium a day. Do not drink alcohol if: Your health care provider tells you not to drink. You are pregnant, may be pregnant, or are planning to become pregnant. If you drink alcohol: Limit how much you have to: 0-1 drink a day for women. 0-2 drinks a day for men. Know how much alcohol is in your drink. In the [place] one drink equals one 12 oz bottle of beer (355 mL), one 5 oz glass of wine (148 mL), or one 1 oz glass of hard liquor (44 mL). Lifestyle  Work with your health care provider to maintain a healthy body weight or to lose weight. Ask what an ideal weight is for you. Get at least 30 minutes of exercise that causes your heart to beat faster (aerobic exercise) most days of the week. Activities may include walking, swimming,  or biking. Include exercise to strengthen your muscles (resistance exercise), such as Pilates or lifting weights, as part of your weekly exercise routine. Try to do these types of exercises for 30 minutes at least 3 days a week. Do not use any products that contain nicotine or tobacco. These products include cigarettes, chewing tobacco, and vaping  devices, such as e-cigarettes. If you need help quitting, ask your health care provider. Monitor your blood pressure at home as told by your health care provider. Keep all follow-up visits. This is important. Medicines Take over-the-counter and prescription medicines only as told by your health care provider. Follow directions carefully. Blood pressure medicines must be taken as prescribed. Do not skip doses of blood pressure medicine. Doing this puts you at risk for problems and can make the medicine less effective. Ask your health care provider about side effects or reactions to medicines that you should watch for. Contact a health care provider if you: Think you are having a reaction to a medicine you are taking. Have headaches that keep coming back (recurring). Feel dizzy. Have swelling in your ankles. Have trouble with your vision. Get help right away if you: Develop a severe headache or confusion. Have unusual weakness or numbness. Feel faint. Have severe pain in your chest or abdomen. Vomit repeatedly. Have trouble breathing. These symptoms may be an emergency. Get help right away. Call 911. Do not wait to see if the symptoms will go away. Do not drive yourself to the hospital. Summary Hypertension is when the force of blood pumping through your arteries is too strong. If this condition is not controlled, it may put you at risk for serious complications. Your personal target blood pressure may vary depending on your medical conditions, your age, and other factors. For most people, a normal blood pressure is less than 120/80. Hypertension is treated with lifestyle changes, medicines, or a combination of both. Lifestyle changes include losing weight, eating a healthy, low-sodium diet, exercising more, and limiting alcohol. This information is not intended to replace advice given to you by your health care provider. Make sure you discuss any questions you have with your health care  provider. Document Revised: 05/07/2021 Document Reviewed: 05/07/2021 Elsevier Patient Education  2024 Else[employer] Inc[employer]     Emil Schaumann, MD [facility]

## 2024-02-23 NOTE — Patient Instructions (Addendum)
 Stop amlodipine  and losartan  Start valsartan  HCT 320-12.5 mg daily Continue monitoring blood pressure at home several times a day for the next couple weeks and contact the office if numbers persistently abnormal.  Hypertension, Adult High blood pressure (hypertension) is when the force of blood pumping through the arteries is too strong. The arteries are the blood vessels that carry blood from the heart throughout the body. Hypertension forces the heart to work harder to pump blood and may cause arteries to become narrow or stiff. Untreated or uncontrolled hypertension can lead to a heart attack, heart failure, a stroke, kidney disease, and other problems. A blood pressure reading consists of a higher number over a lower number. Ideally, your blood pressure should be below 120/80. The first (top) number is called the systolic pressure. It is a measure of the pressure in your arteries as your heart beats. The second (bottom) number is called the diastolic pressure. It is a measure of the pressure in your arteries as the heart relaxes. What are the causes? The exact cause of this condition is not known. There are some conditions that result in high blood pressure. What increases the risk? Certain factors may make you more likely to develop high blood pressure. Some of these risk factors are under your control, including: Smoking. Not getting enough exercise or physical activity. Being overweight. Having too much fat, sugar, calories, or salt (sodium) in your diet. Drinking too much alcohol. Other risk factors include: Having a personal history of heart disease, diabetes, high cholesterol, or kidney disease. Stress. Having a family history of high blood pressure and high cholesterol. Having obstructive sleep apnea. Age. The risk increases with age. What are the signs or symptoms? High blood pressure may not cause symptoms. Very high blood pressure (hypertensive crisis) may  cause: Headache. Fast or irregular heartbeats (palpitations). Shortness of breath. Nosebleed. Nausea and vomiting. Vision changes. Severe chest pain, dizziness, and seizures. How is this diagnosed? This condition is diagnosed by measuring your blood pressure while you are seated, with your arm resting on a flat surface, your legs uncrossed, and your feet flat on the floor. The cuff of the blood pressure monitor will be placed directly against the skin of your upper arm at the level of your heart. Blood pressure should be measured at least twice using the same arm. Certain conditions can cause a difference in blood pressure between your right and left arms. If you have a high blood pressure reading during one visit or you have normal blood pressure with other risk factors, you may be asked to: Return on a different day to have your blood pressure checked again. Monitor your blood pressure at home for 1 week or longer. If you are diagnosed with hypertension, you may have other blood or imaging tests to help your health care provider understand your overall risk for other conditions. How is this treated? This condition is treated by making healthy lifestyle changes, such as eating healthy foods, exercising more, and reducing your alcohol intake. You may be referred for counseling on a healthy diet and physical activity. Your health care provider may prescribe medicine if lifestyle changes are not enough to get your blood pressure under control and if: Your systolic blood pressure is above 130. Your diastolic blood pressure is above 80. Your personal target blood pressure may vary depending on your medical conditions, your age, and other factors. Follow these instructions at home: Eating and drinking  Eat a diet that is high in fiber  and potassium, and low in sodium, added sugar, and fat. An example of this eating plan is called the DASH diet. DASH stands for Dietary Approaches to Stop  Hypertension. To eat this way: Eat plenty of fresh fruits and vegetables. Try to fill one half of your plate at each meal with fruits and vegetables. Eat whole grains, such as whole-wheat pasta, brown rice, or whole-grain bread. Fill about one fourth of your plate with whole grains. Eat or drink low-fat dairy products, such as skim milk or low-fat yogurt. Avoid fatty cuts of meat, processed or cured meats, and poultry with skin. Fill about one fourth of your plate with lean proteins, such as fish, chicken without skin, beans, eggs, or tofu. Avoid pre-made and processed foods. These tend to be higher in sodium, added sugar, and fat. Reduce your daily sodium intake. Many people with hypertension should eat less than 1,500 mg of sodium a day. Do not drink alcohol if: Your health care provider tells you not to drink. You are pregnant, may be pregnant, or are planning to become pregnant. If you drink alcohol: Limit how much you have to: 0-1 drink a day for women. 0-2 drinks a day for men. Know how much alcohol is in your drink. In the [place] one drink equals one 12 oz bottle of beer (355 mL), one 5 oz glass of wine (148 mL), or one 1 oz glass of hard liquor (44 mL). Lifestyle  Work with your health care provider to maintain a healthy body weight or to lose weight. Ask what an ideal weight is for you. Get at least 30 minutes of exercise that causes your heart to beat faster (aerobic exercise) most days of the week. Activities may include walking, swimming, or biking. Include exercise to strengthen your muscles (resistance exercise), such as Pilates or lifting weights, as part of your weekly exercise routine. Try to do these types of exercises for 30 minutes at least 3 days a week. Do not use any products that contain nicotine or tobacco. These products include cigarettes, chewing tobacco, and vaping devices, such as e-cigarettes. If you need help quitting, ask your health care provider. Monitor your  blood pressure at home as told by your health care provider. Keep all follow-up visits. This is important. Medicines Take over-the-counter and prescription medicines only as told by your health care provider. Follow directions carefully. Blood pressure medicines must be taken as prescribed. Do not skip doses of blood pressure medicine. Doing this puts you at risk for problems and can make the medicine less effective. Ask your health care provider about side effects or reactions to medicines that you should watch for. Contact a health care provider if you: Think you are having a reaction to a medicine you are taking. Have headaches that keep coming back (recurring). Feel dizzy. Have swelling in your ankles. Have trouble with your vision. Get help right away if you: Develop a severe headache or confusion. Have unusual weakness or numbness. Feel faint. Have severe pain in your chest or abdomen. Vomit repeatedly. Have trouble breathing. These symptoms may be an emergency. Get help right away. Call 911. Do not wait to see if the symptoms will go away. Do not drive yourself to the hospital. Summary Hypertension is when the force of blood pumping through your arteries is too strong. If this condition is not controlled, it may put you at risk for serious complications. Your personal target blood pressure may vary depending on your medical conditions, your age,  and other factors. For most people, a normal blood pressure is less than 120/80. Hypertension is treated with lifestyle changes, medicines, or a combination of both. Lifestyle changes include losing weight, eating a healthy, low-sodium diet, exercising more, and limiting alcohol. This information is not intended to replace advice given to you by your health care provider. Make sure you discuss any questions you have with your health care provider. Document Revised: 05/07/2021 Document Reviewed: 05/07/2021 Elsevier Patient Education  2024  [employer].

## 2024-02-24 ENCOUNTER — Other Ambulatory Visit: Payer: Self-pay | Admitting: Radiology

## 2024-02-24 DIAGNOSIS — I10 Essential (primary) hypertension: Secondary | ICD-10-CM

## 2024-04-21 ENCOUNTER — Ambulatory Visit: Payer: Self-pay

## 2024-04-21 NOTE — Telephone Encounter (Signed)
 FYI Only or Action Required?: Action required by provider: clinical question for provider.  Patient was last seen in primary care on 02/23/2024 by Purcell Emil Schanz, MD.  Called Nurse Triage reporting Medication Reaction.  Symptoms began several days ago.  Interventions attempted: Rest, hydration, or home remedies.  Symptoms are: gradually worsening.  Triage Disposition: See PCP When Office is Open (Within 3 Days)  Patient/caregiver understands and will follow disposition?: Yes   Copied from CRM #[id number]. Topic: Clinical - Red Word Triage >> Apr 21, 2024  9:18 AM Rea ORN wrote: Red Word that prompted transfer to Nurse Triage: Reason for CRM: Pt believes her BP medication is causing her leg pain, aching and tightness. Reason for Disposition  [1] MODERATE pain (e.g., interferes with normal activities, limping) AND [2] present > 3 days  Answer Assessment - Initial Assessment Questions Pt started new med: valsartan /hydrochlorothiazide  320-12.5mg , 1 month ago, and Monday noticed her legs had began to ache and feel tight from the knee down. States this has happened before when she started other bp meds. States she stopped taking the valsartan  on Tuesday. Denies SOB CP. Pt refuses appointment with separate provider to be seen in 24 hours. Requests only her PCP, appointment scheduled for Monday. States she would like message sent to Dr. Purcell asking for recommendation until then or if he would like to call in a different Rx.  1. ONSET: When did the pain start?      monday 2. LOCATION: Where is the pain located?      BLE; worse in the Left 3. PAIN: How bad is the pain?    (Scale 1-10; or mild, moderate, severe)     10/10 4. WORK OR EXERCISE: Has there been any recent work or exercise that involved this part of the body?      denies 5. CAUSE: What do you think is causing the leg pain?     New bp medication 6. OTHER SYMPTOMS: Do you have any other symptoms? (e.g., chest pain,  back pain, breathing difficulty, swelling, rash, fever, numbness, weakness)     Pain/aching/tightness  Protocols used: Leg Pain-A-AH

## 2024-04-22 NOTE — Telephone Encounter (Signed)
 Will see her on Monday and address these questions then.  Needs to check her blood pressure daily and if it is elevated she could take half a tablet of valsartan  HCT.

## 2024-04-22 NOTE — Telephone Encounter (Signed)
 Spoke with patient and she was informed of provider message and understood

## 2024-04-25 ENCOUNTER — Ambulatory Visit (INDEPENDENT_AMBULATORY_CARE_PROVIDER_SITE_OTHER): Payer: Self-pay | Admitting: Emergency Medicine

## 2024-04-25 ENCOUNTER — Encounter: Payer: Self-pay | Admitting: Emergency Medicine

## 2024-04-25 ENCOUNTER — Ambulatory Visit: Payer: Self-pay | Admitting: Emergency Medicine

## 2024-04-25 VITALS — BP 156/90 | HR 85 | Temp 98.5°F | Ht 63.0 in | Wt 183.0 lb

## 2024-04-25 DIAGNOSIS — I10 Essential (primary) hypertension: Secondary | ICD-10-CM

## 2024-04-25 DIAGNOSIS — M25561 Pain in right knee: Secondary | ICD-10-CM

## 2024-04-25 DIAGNOSIS — M79605 Pain in left leg: Secondary | ICD-10-CM

## 2024-04-25 DIAGNOSIS — G8929 Other chronic pain: Secondary | ICD-10-CM

## 2024-04-25 DIAGNOSIS — M79604 Pain in right leg: Secondary | ICD-10-CM | POA: Insufficient documentation

## 2024-04-25 LAB — LIPID PANEL
Cholesterol: 233 mg/dL — ABNORMAL HIGH (ref 0–200)
HDL: 54 mg/dL (ref >39.00)
LDL Cholesterol: 145 mg/dL — ABNORMAL HIGH (ref 0–99)
NonHDL: 179.35
Total CHOL/HDL Ratio: 4
Triglycerides: 170 mg/dL — ABNORMAL HIGH (ref 0.0–149.0)
VLDL: 34 mg/dL (ref 0.0–40.0)

## 2024-04-25 LAB — COMPREHENSIVE METABOLIC PANEL WITH GFR
ALT: 31 U/L (ref 0–35)
AST: 26 U/L (ref 0–37)
Albumin: 4.8 g/dL (ref 3.5–5.2)
Alkaline Phosphatase: 47 U/L (ref 39–117)
BUN: 14 mg/dL (ref 6–23)
CO2: 28 meq/L (ref 19–32)
Calcium: 10 mg/dL (ref 8.4–10.5)
Chloride: 105 meq/L (ref 96–112)
Creatinine, Ser: 0.81 mg/dL (ref 0.40–1.20)
GFR: 76.68 mL/min (ref >60.00)
Glucose, Bld: 104 mg/dL — ABNORMAL HIGH (ref 70–99)
Potassium: 4.1 meq/L (ref 3.5–5.1)
Sodium: 140 meq/L (ref 135–145)
Total Bilirubin: 0.3 mg/dL (ref 0.2–1.2)
Total Protein: 7.4 g/dL (ref 6.0–8.3)

## 2024-04-25 LAB — VITAMIN B12: Vitamin B-12: 253 pg/mL (ref 211–911)

## 2024-04-25 LAB — CBC WITH DIFFERENTIAL/PLATELET
Basophils Absolute: 0 K/uL (ref 0.0–0.1)
Basophils Relative: 0.6 % (ref 0.0–3.0)
Eosinophils Absolute: 0.1 K/uL (ref 0.0–0.7)
Eosinophils Relative: 1.9 % (ref 0.0–5.0)
HCT: 39.4 % (ref 36.0–46.0)
Hemoglobin: 13 g/dL (ref 12.0–15.0)
Lymphocytes Relative: 34.2 % (ref 12.0–46.0)
Lymphs Abs: 1.7 K/uL (ref 0.7–4.0)
MCHC: 32.9 g/dL (ref 30.0–36.0)
MCV: 93.1 fl (ref 78.0–100.0)
Monocytes Absolute: 0.3 K/uL (ref 0.1–1.0)
Monocytes Relative: 6.1 % (ref 3.0–12.0)
Neutro Abs: 2.9 K/uL (ref 1.4–7.7)
Neutrophils Relative %: 57.2 % (ref 43.0–77.0)
Platelets: 176 K/uL (ref 150.0–400.0)
RBC: 4.23 Mil/uL (ref 3.87–5.11)
RDW: 13.8 % (ref 11.5–15.5)
WBC: 5 K/uL (ref 4.0–10.5)

## 2024-04-25 LAB — VITAMIN D 25 HYDROXY (VIT D DEFICIENCY, FRACTURES): VITD: 28.07 ng/mL — ABNORMAL LOW (ref 30.00–100.00)

## 2024-04-25 LAB — HEMOGLOBIN A1C: Hgb A1c MFr Bld: 6.2 % (ref 4.6–6.5)

## 2024-04-25 MED ORDER — TRAMADOL HCL 50 MG PO TABS: 50.0000 mg | ORAL_TABLET | Freq: Three times a day (TID) | ORAL | 1 refills | Status: AC | PRN

## 2024-04-25 NOTE — Progress Notes (Signed)
 Priscilla Lane 63 y.o.   Chief Complaint  Patient presents with   Leg Pain    Patient here for leg pain. Patient states Wednesday is when it started, mainly left leg but both hurt. Tylenol  hasnt helped     HISTORY OF PRESENT ILLNESS: This is a 63 y.o. female complaining of bilateral leg pain since last week Intermittent pain.  Better today.  No other associated symptoms. No other complaints or medical concerns today.  Leg Pain      Prior to Admission medications   Medication Sig Start Date End Date Taking? Authorizing Provider  albuterol  (VENTOLIN  HFA) 108 (90 Base) MCG/ACT inhaler Inhale 2 puffs into the lungs every 6 (six) hours as needed for wheezing or shortness of breath. 02/04/24  Yes Norleen Lynwood ORN, MD  aspirin  81 MG tablet Take 1 tablet (81 mg total) by mouth daily. 08/14/16  Yes Stallings, Zoe A, MD  naproxen  (NAPROSYN ) 500 MG tablet Take 1 tablet (500 mg total) by mouth 2 (two) times daily with a meal. 11/11/23  Yes Antwine Agosto, Emil Schanz, MD  rosuvastatin  (CRESTOR ) 20 MG tablet Take 1 tablet (20 mg total) by mouth daily. 11/20/22  Yes Skya Mccullum, Emil Schanz, MD  valsartan -hydrochlorothiazide  (DIOVAN -HCT) 320-12.5 MG tablet Take 1 tablet by mouth daily. Patient not taking: Reported on 04/25/2024 02/23/24   Purcell Emil Schanz, MD    Allergies  Allergen Reactions   Amlodipine      Lower extremity edema   Lisinopril      Cough, muscle cramps   Cephalexin  Rash    Taking cephalexin  for 2 days and is broken out in a facial rash with swelling of cheeks and eyelids. Incidentally, was also taking some ibuprofen  at the same time but she has tolerated that well in the past and it probably is not the allergen.    Patient Active Problem List   Diagnosis Date Noted   Spinal stenosis at L4-L5 level 09/07/2023   Osteoarthritis of spine with radiculopathy, lumbar region 09/07/2023   Arthritis of right knee 09/07/2023   Chronic pain of right knee 09/07/2023   Dyslipidemia 05/07/2021    Situational mixed anxiety and depressive disorder 01/31/2021   Hypertension, essential    Class 1 obesity due to excess calories with serious comorbidity and body mass index (BMI) of 33.0 to 33.9 in adult     Past Medical History:  Diagnosis Date   Allergic rhinitis    pollen   Hypertension, essential    Obesity     Past Surgical History:  Procedure Laterality Date   ABDOMINOPLASTY  2003   COLONOSCOPY  06/2014   polyp - colon tissue, mod diverticulosis, rpt 10 yrs ([place])   saline infusion US      for postmenopausal bleeding/fibroids/thickened endometrium Environmental consultant)    Social History   Socioeconomic History   Marital status: Married    Spouse name: Not on file   Number of children: 3   Years of education: Not on file   Highest education level: High school graduate  Occupational History   Not on file  Tobacco Use   Smoking status: Never   Smokeless tobacco: Never  Vaping Use   Vaping status: Never Used  Substance and Sexual Activity   Alcohol use: No    Alcohol/week: 0.0 standard drinks of alcohol   Drug use: No   Sexual activity: Not Currently  Other Topics Concern   Not on file  Social History Narrative   Lives with husband, 1 dog   Grown children, 3  grandchildren   Occupation: homemaker, 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   Edu: HS   Activity: 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   Diet: good water, fruits/vegetables daily   Social Drivers of Health   Financial Resource Strain: Not on file  Food Insecurity: No Food Insecurity (02/14/2021)   Hunger Vital Sign    Worried About Running Out of Food in the Last Year: Never true    Ran Out of Food in the Last Year: Never true  Transportation Needs: No Transportation Needs (02/14/2021)   PRAPARE - Administrator, Civil Service (Medical): No    Lack of Transportation (Non-Medical): No  Physical Activity: Not on file  Stress: Not on file  Social Connections: Not on file  Intimate Partner Violence: Not on file    Family  History  Problem Relation Age of Onset   Diabetes Mother    CAD Mother 56       CABG   Hypertension Mother    Kidney disease Mother        ESRD on HD   Colon polyps Father    Cancer Maternal Grandmother 14       GYN   Ovarian cancer Maternal Grandmother    Cancer Maternal Grandfather        HEENT   Colon cancer Neg Hx    Rectal cancer Neg Hx    Stomach cancer Neg Hx      Review of Systems  Constitutional: Negative.  Negative for chills and fever.  HENT: Negative.  Negative for congestion and sore throat.   Respiratory: Negative.  Negative for cough and shortness of breath.   Cardiovascular: Negative.  Negative for chest pain and palpitations.  Gastrointestinal:  Negative for abdominal pain, diarrhea, nausea and vomiting.  Genitourinary: Negative.  Negative for dysuria and hematuria.  Skin: Negative.  Negative for rash.  Neurological: Negative.  Negative for dizziness and headaches.  All other systems reviewed and are negative.   Today's Vitals   04/25/24 1318  BP: (!) 156/90  Pulse: 85  Temp: 98.5 F (36.9 C)  TempSrc: Oral  SpO2: 95%  Weight: 183 lb (83 kg)  Height: 5' 3 (1.6 m)   Body mass index is 32.42 kg/m.   Physical Exam Vitals reviewed.  Constitutional:      Appearance: Normal appearance.  HENT:     Head: Normocephalic.  Eyes:     Extraocular Movements: Extraocular movements intact.  Cardiovascular:     Rate and Rhythm: Normal rate.  Pulmonary:     Effort: Pulmonary effort is normal.  Musculoskeletal:     Right lower leg: No edema.     Left lower leg: No edema.     Comments: Lower extremities: Warm to touch.  No edema.  Excellent peripheral circulation. Excellent distal pulses with excellent capillary refill.  Intact sensation.  Skin:    General: Skin is warm and dry.  Neurological:     Mental Status: She is alert and oriented to person, place, and time.  Psychiatric:        Mood and Affect: Mood normal.        Behavior: Behavior normal.       ASSESSMENT & PLAN: Problem List Items Addressed This Visit       Cardiovascular and Mediastinum   Hypertension, essential   BP Readings from Last 3 Encounters:  04/25/24 (!) 156/90  02/23/24 (!) 170/86  02/04/24 (!) 186/82  Elevated blood pressure reading in the office today Patient states she was told by  a nurse to decrease blood pressure medication and have to see if this is what is causing her leg cramps Advised patient she needs to restart valsartan  HCT full dose of 320-12.5 mg daily Monitor blood pressure readings at home and contact the office if numbers persistently abnormal         Other   Chronic pain of right knee   Has been able to follow-up with orthopedist who has injected her knees 3 times Leg pain today related to chronic bilateral knee pain      Bilateral leg pain - Primary   Unremarkable exam. Chronic knee pain contributing to lower leg pains Pain management discussed Advised to take Tylenol  for mild to moderate pain and tramadol for moderate to severe pain Advised to follow-up with her orthopedist.      Relevant Medications   traMADol (ULTRAM) 50 MG tablet   Other Relevant Orders   Comprehensive metabolic panel with GFR   CBC with Differential/Platelet   Hemoglobin A1c   Lipid panel   Vitamin B12   VITAMIN D 25 Hydroxy (Vit-D Deficiency, Fractures)   Patient Instructions  Health Maintenance, Female Adopting a healthy lifestyle and getting preventive care are important in promoting health and wellness. Ask your health care provider about: The right schedule for you to have regular tests and exams. Things you can do on your own to prevent diseases and keep yourself healthy. What should I know about diet, weight, and exercise? Eat a healthy diet  Eat a diet that includes plenty of vegetables, fruits, low-fat dairy products, and lean protein. Do not eat a lot of foods that are high in solid fats, added sugars, or sodium. Maintain a healthy  weight Body mass index (BMI) is used to identify weight problems. It estimates body fat based on height and weight. Your health care provider can help determine your BMI and help you achieve or maintain a healthy weight. Get regular exercise Get regular exercise. This is one of the most important things you can do for your health. Most adults should: Exercise for at least 150 minutes each week. The exercise should increase your heart rate and make you sweat (moderate-intensity exercise). Do strengthening exercises at least twice a week. This is in addition to the moderate-intensity exercise. Spend less time sitting. Even light physical activity can be beneficial. Watch cholesterol and blood lipids Have your blood tested for lipids and cholesterol at 64 years of age, then have this test every 5 years. Have your cholesterol levels checked more often if: Your lipid or cholesterol levels are high. You are older than 64 years of age. You are at high risk for heart disease. What should I know about cancer screening? Depending on your health history and family history, you may need to have cancer screening at various ages. This may include screening for: Breast cancer. Cervical cancer. Colorectal cancer. Skin cancer. Lung cancer. What should I know about heart disease, diabetes, and high blood pressure? Blood pressure and heart disease High blood pressure causes heart disease and increases the risk of stroke. This is more likely to develop in people who have high blood pressure readings or are overweight. Have your blood pressure checked: Every 3-5 years if you are 82-23 years of age. Every year if you are 49 years old or older. Diabetes Have regular diabetes screenings. This checks your fasting blood sugar level. Have the screening done: Once every three years after age 53 if you are at a normal weight and have  a low risk for diabetes. More often and at a younger age if you are overweight or  have a high risk for diabetes. What should I know about preventing infection? Hepatitis B If you have a higher risk for hepatitis B, you should be screened for this virus. Talk with your health care provider to find out if you are at risk for hepatitis B infection. Hepatitis C Testing is recommended for: Everyone born from [birth year] through 1966. Anyone with known risk factors for hepatitis C. Sexually transmitted infections (STIs) Get screened for STIs, including gonorrhea and chlamydia, if: You are sexually active and are younger than 64 years of age. You are older than 64 years of age and your health care provider tells you that you are at risk for this type of infection. Your sexual activity has changed since you were last screened, and you are at increased risk for chlamydia or gonorrhea. Ask your health care provider if you are at risk. Ask your health care provider about whether you are at high risk for HIV. Your health care provider may recommend a prescription medicine to help prevent HIV infection. If you choose to take medicine to prevent HIV, you should first get tested for HIV. You should then be tested every 3 months for as long as you are taking the medicine. Pregnancy If you are about to stop having your period (premenopausal) and you may become pregnant, seek counseling before you get pregnant. Take 400 to 800 micrograms (mcg) of folic acid every day if you become pregnant. Ask for birth control (contraception) if you want to prevent pregnancy. Osteoporosis and menopause Osteoporosis is a disease in which the bones lose minerals and strength with aging. This can result in bone fractures. If you are 41 years old or older, or if you are at risk for osteoporosis and fractures, ask your health care provider if you should: Be screened for bone loss. Take a calcium  or vitamin D supplement to lower your risk of fractures. Be given hormone replacement therapy (HRT) to treat symptoms of  menopause. Follow these instructions at home: Alcohol use Do not drink alcohol if: Your health care provider tells you not to drink. You are pregnant, may be pregnant, or are planning to become pregnant. If you drink alcohol: Limit how much you have to: 0-1 drink a day. Know how much alcohol is in your drink. In the [place] one drink equals one 12 oz bottle of beer (355 mL), one 5 oz glass of wine (148 mL), or one 1 oz glass of hard liquor (44 mL). Lifestyle Do not use any products that contain nicotine or tobacco. These products include cigarettes, chewing tobacco, and vaping devices, such as e-cigarettes. If you need help quitting, ask your health care provider. Do not use street drugs. Do not share needles. Ask your health care provider for help if you need support or information about quitting drugs. General instructions Schedule regular health, dental, and eye exams. Stay current with your vaccines. Tell your health care provider if: You often feel depressed. You have ever been abused or do not feel safe at home. Summary Adopting a healthy lifestyle and getting preventive care are important in promoting health and wellness. Follow your health care provider's instructions about healthy diet, exercising, and getting tested or screened for diseases. Follow your health care provider's instructions on monitoring your cholesterol and blood pressure. This information is not intended to replace advice given to you by your health care provider. Make sure  you discuss any questions you have with your health care provider. Document Revised: 11/19/2020 Document Reviewed: 11/19/2020 Elsevier Patient Education  2024 [employer]    Emil Schaumann, MD [facility]

## 2024-04-25 NOTE — Assessment & Plan Note (Signed)
 Unremarkable exam. Chronic knee pain contributing to lower leg pains Pain management discussed Advised to take Tylenol  for mild to moderate pain and tramadol for moderate to severe pain Advised to follow-up with her orthopedist.

## 2024-04-25 NOTE — Assessment & Plan Note (Signed)
 BP Readings from Last 3 Encounters:  04/25/24 (!) 156/90  02/23/24 (!) 170/86  02/04/24 (!) 186/82  Elevated blood pressure reading in the office today Patient states she was told by a nurse to decrease blood pressure medication and have to see if this is what is causing her leg cramps Advised patient she needs to restart valsartan  HCT full dose of 320-12.5 mg daily Monitor blood pressure readings at home and contact the office if numbers persistently abnormal

## 2024-04-25 NOTE — Patient Instructions (Signed)

## 2024-04-25 NOTE — Assessment & Plan Note (Signed)
 Has been able to follow-up with orthopedist who has injected her knees 3 times Leg pain today related to chronic bilateral knee pain

## 2024-05-16 ENCOUNTER — Encounter: Payer: Self-pay | Admitting: Radiology

## 2024-05-26 ENCOUNTER — Ambulatory Visit: Payer: Self-pay | Admitting: Emergency Medicine
